# Patient Record
Sex: Male | Born: 1955 | Race: White | Hispanic: No | Marital: Single | Smoking: Current every day smoker
Health system: Southern US, Community
[De-identification: ages and names within clinical notes are randomized; demographics above are authoritative.]

## PROBLEM LIST (undated history)

## (undated) DIAGNOSIS — Z9889 Other specified postprocedural states: Secondary | ICD-10-CM

## (undated) DIAGNOSIS — E039 Hypothyroidism, unspecified: Secondary | ICD-10-CM

## (undated) DIAGNOSIS — I10 Essential (primary) hypertension: Secondary | ICD-10-CM

## (undated) DIAGNOSIS — E785 Hyperlipidemia, unspecified: Secondary | ICD-10-CM

## (undated) DIAGNOSIS — M1990 Unspecified osteoarthritis, unspecified site: Secondary | ICD-10-CM

## (undated) DIAGNOSIS — Z720 Tobacco use: Secondary | ICD-10-CM

## (undated) HISTORY — DX: Unspecified osteoarthritis, unspecified site: M19.90

## (undated) HISTORY — PX: BACK SURGERY: SHX140

## (undated) HISTORY — DX: Hyperlipidemia, unspecified: E78.5

## (undated) HISTORY — DX: Hypothyroidism, unspecified: E03.9

---

## 2013-11-27 ENCOUNTER — Emergency Department: Payer: Self-pay | Admitting: Emergency Medicine

## 2014-05-19 DIAGNOSIS — Z8673 Personal history of transient ischemic attack (TIA), and cerebral infarction without residual deficits: Secondary | ICD-10-CM

## 2014-05-19 HISTORY — DX: Personal history of transient ischemic attack (TIA), and cerebral infarction without residual deficits: Z86.73

## 2014-12-26 ENCOUNTER — Inpatient Hospital Stay (HOSPITAL_COMMUNITY)
Admit: 2014-12-26 | Discharge: 2014-12-26 | Disposition: A | Discharge status: Home / Self Care | Payer: Self-pay | Attending: Internal Medicine | Admitting: Internal Medicine

## 2014-12-26 ENCOUNTER — Emergency Department: Payer: Self-pay

## 2014-12-26 ENCOUNTER — Inpatient Hospital Stay: Payer: Self-pay

## 2014-12-26 ENCOUNTER — Inpatient Hospital Stay
Admission: EM | Admit: 2014-12-26 | Discharge: 2014-12-28 | DRG: 066 | Disposition: A | Discharge status: Home / Self Care | Payer: Self-pay | Attending: Internal Medicine | Admitting: Internal Medicine

## 2014-12-26 DIAGNOSIS — I10 Essential (primary) hypertension: Secondary | ICD-10-CM | POA: Diagnosis present

## 2014-12-26 DIAGNOSIS — I639 Cerebral infarction, unspecified: Principal | ICD-10-CM | POA: Diagnosis present

## 2014-12-26 DIAGNOSIS — G459 Transient cerebral ischemic attack, unspecified: Secondary | ICD-10-CM

## 2014-12-26 DIAGNOSIS — Z9889 Other specified postprocedural states: Secondary | ICD-10-CM

## 2014-12-26 DIAGNOSIS — Z716 Tobacco abuse counseling: Secondary | ICD-10-CM | POA: Diagnosis present

## 2014-12-26 DIAGNOSIS — D72829 Elevated white blood cell count, unspecified: Secondary | ICD-10-CM | POA: Diagnosis present

## 2014-12-26 DIAGNOSIS — F17210 Nicotine dependence, cigarettes, uncomplicated: Secondary | ICD-10-CM | POA: Diagnosis present

## 2014-12-26 DIAGNOSIS — I161 Hypertensive emergency: Secondary | ICD-10-CM | POA: Diagnosis present

## 2014-12-26 DIAGNOSIS — Z833 Family history of diabetes mellitus: Secondary | ICD-10-CM

## 2014-12-26 HISTORY — DX: Tobacco use: Z72.0

## 2014-12-26 HISTORY — DX: Other specified postprocedural states: Z98.890

## 2014-12-26 HISTORY — DX: Essential (primary) hypertension: I10

## 2014-12-26 LAB — COMPREHENSIVE METABOLIC PANEL
ALT: 28 U/L (ref 17–63)
ANION GAP: 11 (ref 5–15)
AST: 34 U/L (ref 15–41)
Albumin: 4.8 g/dL (ref 3.5–5.0)
Alkaline Phosphatase: 81 U/L (ref 38–126)
BUN: 16 mg/dL (ref 6–20)
CHLORIDE: 103 mmol/L (ref 101–111)
CO2: 25 mmol/L (ref 22–32)
CREATININE: 1.22 mg/dL (ref 0.61–1.24)
Calcium: 9.9 mg/dL (ref 8.9–10.3)
GFR calc Af Amer: >60 mL/min (ref >60)
GFR calc non Af Amer: >60 mL/min (ref >60)
Glucose, Bld: 127 mg/dL — ABNORMAL HIGH (ref 65–99)
Potassium: 3.5 mmol/L (ref 3.5–5.1)
Sodium: 139 mmol/L (ref 135–145)
Total Bilirubin: 0.4 mg/dL (ref 0.3–1.2)
Total Protein: 8.2 g/dL — ABNORMAL HIGH (ref 6.5–8.1)

## 2014-12-26 LAB — DIFFERENTIAL
Basophils Absolute: 0.1 10*3/uL (ref 0–0.1)
Basophils Relative: 1 %
Eosinophils Absolute: 0.2 10*3/uL (ref 0–0.7)
Eosinophils Relative: 1 %
LYMPHS PCT: 24 %
Lymphs Abs: 3.3 10*3/uL (ref 1.0–3.6)
MONO ABS: 1.3 10*3/uL — AB (ref 0.2–1.0)
Monocytes Relative: 10 %
Neutro Abs: 9 10*3/uL — ABNORMAL HIGH (ref 1.4–6.5)
Neutrophils Relative %: 64 %

## 2014-12-26 LAB — CBC
HCT: 46 % (ref 40.0–52.0)
Hemoglobin: 15.6 g/dL (ref 13.0–18.0)
MCH: 29.4 pg (ref 26.0–34.0)
MCHC: 33.8 g/dL (ref 32.0–36.0)
MCV: 86.9 fL (ref 80.0–100.0)
PLATELETS: 360 10*3/uL (ref 150–440)
RBC: 5.3 MIL/uL (ref 4.40–5.90)
RDW: 12.7 % (ref 11.5–14.5)
WBC: 13.9 10*3/uL — ABNORMAL HIGH (ref 3.8–10.6)

## 2014-12-26 LAB — MRSA PCR SCREENING: MRSA BY PCR: NEGATIVE

## 2014-12-26 LAB — TROPONIN I: Troponin I: <0.03 ng/mL (ref <0.031)

## 2014-12-26 LAB — APTT: APTT: 38 s — AB (ref 24–36)

## 2014-12-26 LAB — PROTIME-INR
INR: 0.83
Prothrombin Time: 11.6 seconds (ref 11.4–15.0)

## 2014-12-26 MED ORDER — LABETALOL HCL 5 MG/ML IV SOLN
10.0000 mg | Freq: Once | INTRAVENOUS | Status: AC
Start: 2014-12-26 — End: 2014-12-26
  Administered 2014-12-26: 20 mg via INTRAVENOUS

## 2014-12-26 MED ORDER — LABETALOL HCL 5 MG/ML IV SOLN
20.0000 mg | Freq: Once | INTRAVENOUS | Status: AC
Start: 2014-12-26 — End: 2014-12-26
  Administered 2014-12-26: 20 mg via INTRAVENOUS
  Filled 2014-12-26: qty 4

## 2014-12-26 MED ORDER — ENOXAPARIN SODIUM 40 MG/0.4ML ~~LOC~~ SOLN
40.0000 mg | SUBCUTANEOUS | Status: DC
  Administered 2014-12-26 – 2014-12-27 (×2): 40 mg via SUBCUTANEOUS
  Filled 2014-12-26 (×2): qty 0.4

## 2014-12-26 MED ORDER — LABETALOL HCL 5 MG/ML IV SOLN
0.5000 mg/min | INTRAVENOUS | Status: DC
  Administered 2014-12-26: 0.5 mg/min via INTRAVENOUS
  Filled 2014-12-26: qty 100

## 2014-12-26 MED ORDER — TRIAMTERENE-HCTZ 37.5-25 MG PO TABS
1.0000 | ORAL_TABLET | Freq: Every day | ORAL | Status: DC
  Administered 2014-12-26 – 2014-12-28 (×3): 1 via ORAL
  Filled 2014-12-26 (×3): qty 1

## 2014-12-26 MED ORDER — ACETAMINOPHEN 650 MG RE SUPP: 650.0000 mg | Freq: Four times a day (QID) | RECTAL | Status: DC | PRN

## 2014-12-26 MED ORDER — ATORVASTATIN CALCIUM 20 MG PO TABS
40.0000 mg | ORAL_TABLET | Freq: Every day | ORAL | Status: DC
  Administered 2014-12-26 – 2014-12-27 (×2): 40 mg via ORAL
  Filled 2014-12-26 (×2): qty 2

## 2014-12-26 MED ORDER — ONDANSETRON HCL 4 MG/2ML IJ SOLN: 4.0000 mg | Freq: Four times a day (QID) | INTRAMUSCULAR | Status: DC | PRN

## 2014-12-26 MED ORDER — ASPIRIN EC 81 MG PO TBEC
81.0000 mg | DELAYED_RELEASE_TABLET | Freq: Every day | ORAL | Status: DC
  Administered 2014-12-27 – 2014-12-28 (×2): 81 mg via ORAL
  Filled 2014-12-26 (×2): qty 1

## 2014-12-26 MED ORDER — LABETALOL HCL 5 MG/ML IV SOLN
INTRAVENOUS | Status: AC
  Administered 2014-12-26: 20 mg via INTRAVENOUS
  Filled 2014-12-26: qty 4

## 2014-12-26 MED ORDER — SENNOSIDES-DOCUSATE SODIUM 8.6-50 MG PO TABS: 1.0000 | ORAL_TABLET | Freq: Every evening | ORAL | Status: DC | PRN

## 2014-12-26 MED ORDER — ALBUTEROL SULFATE (2.5 MG/3ML) 0.083% IN NEBU
2.5000 mg | INHALATION_SOLUTION | RESPIRATORY_TRACT | Status: DC | PRN
Start: 2014-12-26 — End: 2014-12-28

## 2014-12-26 MED ORDER — ACETAMINOPHEN 325 MG PO TABS: 650.0000 mg | ORAL_TABLET | Freq: Four times a day (QID) | ORAL | Status: DC | PRN

## 2014-12-26 MED ORDER — SODIUM CHLORIDE 0.9 % IJ SOLN
3.0000 mL | Freq: Two times a day (BID) | INTRAMUSCULAR | Status: DC
  Administered 2014-12-26 – 2014-12-28 (×4): 3 mL via INTRAVENOUS

## 2014-12-26 MED ORDER — ASPIRIN 81 MG PO CHEW
324.0000 mg | CHEWABLE_TABLET | Freq: Once | ORAL | Status: AC
  Administered 2014-12-26: 324 mg via ORAL
  Filled 2014-12-26: qty 4

## 2014-12-26 MED ORDER — ONDANSETRON HCL 4 MG PO TABS: 4.0000 mg | ORAL_TABLET | Freq: Four times a day (QID) | ORAL | Status: DC | PRN

## 2014-12-26 MED ORDER — IBUPROFEN 400 MG PO TABS
200.0000 mg | ORAL_TABLET | Freq: Four times a day (QID) | ORAL | Status: DC | PRN
Start: 2014-12-26 — End: 2014-12-28

## 2014-12-26 NOTE — H&P (Signed)
[facility] [place] at [facility]   PATIENT NAME: Alejandro Rush    MR#:  [id number]  DATE OF BIRTH:  23-Apr-1956  DATE OF ADMISSION:  12/26/2014  PRIMARY CARE PHYSICIAN: No primary care provider on file.   REQUESTING/REFERRING PHYSICIAN: Dr. Lenard Lance  CHIEF COMPLAINT:   Chief Complaint  Patient presents with  . Code Stroke    HISTORY OF PRESENT ILLNESS:  Alejandro Rush  is a 59 y.o. male with a known history of hypertension and tobacco abuse presents to the emergency room with acute onset of right-sided numbness at 1:30 PM. A code stroke was alerted. Neurology has seen the patient. With only numbness and low NIH score patient was not thought to be a TPA candidate by neurology. Patient's blood pressure has been severely elevated at 240 /140. He has received IV labetalol pushes with no significant improvement. Symptoms are the same. CT scan of the head showed no acute stroke. No dysphagia or change in vision. No motor weakness. No history of stroke.  PAST MEDICAL HISTORY:   Past Medical History  Diagnosis Date  . Hypertension   . Previous back surgery   . Tobacco abuse     PAST SURGICAL HISTORY:   Past Surgical History  Procedure Laterality Date  . Back surgery      SOCIAL HISTORY:   History  Substance Use Topics  . Smoking status: Current Every Day Smoker -- 1.00 packs/day    Types: Cigarettes  . Smokeless tobacco: Not on file  . Alcohol Use: Yes    FAMILY HISTORY:   Family History  Problem Relation Age of Onset  . Diabetes Mother     DRUG ALLERGIES:  No Known Allergies  REVIEW OF SYSTEMS:   Review of Systems  Constitutional: Positive for malaise/fatigue. Negative for fever, chills and weight loss.  HENT: Negative for hearing loss and nosebleeds.   Eyes: Negative for blurred vision, double vision and pain.  Respiratory: Negative for cough, hemoptysis, sputum production, shortness of breath and wheezing.   Cardiovascular:  Negative for chest pain, palpitations, orthopnea and leg swelling.  Gastrointestinal: Negative for nausea, vomiting, abdominal pain, diarrhea and constipation.  Genitourinary: Negative for dysuria and hematuria.  Musculoskeletal: Positive for back pain. Negative for myalgias and falls.  Skin: Negative for rash.  Neurological: Positive for sensory change. Negative for dizziness, tremors, speech change, focal weakness, seizures and headaches.  Endo/Heme/Allergies: Does not bruise/bleed easily.  Psychiatric/Behavioral: Negative for depression and memory loss. The patient is not nervous/anxious.     MEDICATIONS AT HOME:   Prior to Admission medications   Not on File      VITAL SIGNS:  Blood pressure 191/128, pulse 81, temperature 97.8 F (36.6 C), temperature source Oral, resp. rate 14, height 6' (1.829 m), weight 104.327 kg (230 lb), SpO2 99 %.  PHYSICAL EXAMINATION:  Physical Exam  GENERAL:  59 y.o.-year-old patient lying in the bed with no acute distress.  EYES: Pupils equal, round, reactive to light and accommodation. No scleral icterus. Extraocular muscles intact.  HEENT: Head atraumatic, normocephalic. Oropharynx and nasopharynx clear. No oropharyngeal erythema, moist oral mucosa  NECK:  Supple, no jugular venous distention. No thyroid enlargement, no tenderness.  LUNGS: Normal breath sounds bilaterally, no wheezing, rales, rhonchi. No use of accessory muscles of respiration.  CARDIOVASCULAR: S1, S2 normal. No murmurs, rubs, or gallops.  ABDOMEN: Soft, nontender, nondistended. Bowel sounds present. No organomegaly or mass.  EXTREMITIES: No pedal edema, cyanosis, or clubbing. + 2 pedal & radial  pulses b/l.   NEUROLOGIC: Cranial nerves II through XII are intact. No focal Motor deficits appreciated b/l. Right numbness. PSYCHIATRIC: The patient is alert and oriented x 3. Good affect.  SKIN: No obvious rash, lesion, or ulcer.   LABORATORY PANEL:   CBC  Recent Labs Lab  12/26/14 1521  WBC 13.9*  HGB 15.6  HCT 46.0  PLT 360   ------------------------------------------------------------------------------------------------------------------  Chemistries   Recent Labs Lab 12/26/14 1521  NA 139  K 3.5  CL 103  CO2 25  GLUCOSE 127*  BUN 16  CREATININE 1.22  CALCIUM 9.9  AST 34  ALT 28  ALKPHOS 81  BILITOT 0.4   ------------------------------------------------------------------------------------------------------------------  Cardiac Enzymes  Recent Labs Lab 12/26/14 1521  TROPONINI <0.03   ------------------------------------------------------------------------------------------------------------------  RADIOLOGY:  Ct Head Wo Contrast  12/26/2014   CLINICAL DATA:  Right facial pain and right arm numbness starting 1 p.m. today  EXAM: CT HEAD WITHOUT CONTRAST  TECHNIQUE: Contiguous axial images were obtained from the base of the skull through the vertex without intravenous contrast.  COMPARISON:  None.  FINDINGS: No skull fracture is noted. Paranasal sinuses and mastoid air cells are unremarkable. No intracranial hemorrhage, mass effect or midline shift.  No acute cortical infarction. No mass lesion is noted on this unenhanced scan. The gray and white-matter differentiation is preserved. 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  IMPRESSION: No acute intracranial abnormality. No definite acute cortical infarction. No hemorrhage or mass effect.  These results were called by telephone at the time of interpretation on 12/26/2014 at 3:18 pm to Dr. Sharman Cheek , who verbally acknowledged these results.   Electronically Signed   By: Natasha Mead M.D.   On: 12/26/2014 15:18     IMPRESSION AND PLAN:   * Acute CVA with right-sided numbness. Not a candidate for TPA. Seen by tele neurology. Start aspirin and statin. Order MRI of the brain along with echocardiogram and carotid Doppler studies. Telemetry monitoring. Neuro  checks every 4 hours. Consult neurology.  * Hypertensive emergency. Blood pressure severely elevated at systolic of 240. He has received labetalol pushes with no significant response. Start labetalol drip and admitted to ICU.  * Tobacco abuse - counseled to quit. > 3 minutes spent.  All the records are reviewed and case discussed with ED provider. Management plans discussed with the patient, family and they are in agreement.  CODE STATUS: FULL  TOTAL CRITICAL CARE TIME TAKING CARE OF THIS PATIENT: 35 minutes.    Milagros Loll R M.D on 12/26/2014 at 4:45 PM  Between 7am to 6pm - Pager - [phone]  After 6pm go to [email or web address] - password [email or web address] [facility]  [facility]  Office  [phone]  CC: Primary care physician; No primary care provider on file.

## 2014-12-26 NOTE — Progress Notes (Signed)
*  PRELIMINARY RESULTS* Echocardiogram 2D Echocardiogram has been performed.  Alejandro Rush 12/26/2014, 10:11 PM

## 2014-12-26 NOTE — ED Notes (Signed)
MD at bedside., Dr.Paduchowski  

## 2014-12-26 NOTE — ED Notes (Signed)
Family at bedside. 

## 2014-12-26 NOTE — ED Notes (Signed)
teleneuro , speaking to pt

## 2014-12-26 NOTE — ED Notes (Signed)
Pt sent from PCP with c/o sudden onset right facial, arm, leg numbness that started at 1pm today.  No noted facial drop at present.   No slurred speech

## 2014-12-26 NOTE — ED Provider Notes (Signed)
[facility] Emergency Department Provider Note  Time seen: 3:36 PM  I have reviewed the triage vital signs and the nursing notes.   HISTORY  Chief Complaint Code Stroke    HPI Alejandro Rush is a 59 y.o. male with a past medical history of hypertension presents the emergency department with right-sided numbness.Patient states that 1 PM today he developed right-sided numbness. Denies any weakness, headache, slurred speech or confusion. He states the right-sided numbness is mostly in the right side of his face and right arm. Denies any history of CVA in the past. Patient went to [place] clinic, and was quickly referred to the emergency department. Denies any headache at this time. Patient does have significant hypertension 250/140 currently. Denies nausea, vomiting, chest pain.     Past Medical History  Diagnosis Date  . Hypertension     There are no active problems to display for this patient.   No past surgical history on file.  No current outpatient prescriptions on file.  Allergies Review of patient's allergies indicates no known allergies.  No family history on file.  Social History History  Substance Use Topics  . Smoking status: Not on file  . Smokeless tobacco: Not on file  . Alcohol Use: Not on file    Review of Systems Constitutional: Negative for fever Cardiovascular: Negative for chest pain. Respiratory: Negative for shortness of breath. Gastrointestinal: Negative for abdominal pain Neurological: Negative for headaches, focal weakness. Positive for right-sided numbness.  10-point ROS otherwise negative.  ____________________________________________   PHYSICAL EXAM:  VITAL SIGNS: ED Triage Vitals  Enc Vitals Group     BP 12/26/14 1508 251/142 mmHg     Pulse Rate 12/26/14 1508 101     Resp 12/26/14 1508 20     Temp 12/26/14 1508 97.8 F (36.6 C)     Temp Source 12/26/14 1508 Oral     SpO2 12/26/14 1508 98 %     Weight  12/26/14 1508 230 lb (104.327 kg)     Height 12/26/14 1508 6' (1.829 m)     Head Cir --      Peak Flow --      Pain Score --      Pain Loc --      Pain Edu? --      Excl. in GC? --     Constitutional: Alert and oriented. Well appearing and in no distress. Eyes: Normal exam ENT   Head: Normocephalic and atraumatic Cardiovascular: Normal rate, regular rhythm. No murmur Respiratory: Normal respiratory effort without tachypnea nor retractions. Breath sounds are clear and equal bilaterally. No wheezes/rales/rhonchi. Gastrointestinal: Soft and nontender. No distention.   Musculoskeletal: Nontender with normal range of motion in all extremities.  Neurologic:  Normal speech and language. 5/5 motor in all extremities. Motor exam somewhat limited in left lower extremity due to prior knee injury, the motor appears 5/5. No pronator drift in upper extremities. No drift in lower extremities. Cranial nerves intact, no facial droop. Speech is normal. Patient does have an irregularity of the pupils, right pupils proximally 3 mm, left pupil 2 mm. Both pupils are reactive. Patient states sensation is equal in bilateral lower extremities. He states sensation is significantly decreased in right upper extremity and right face. He is able to feel me touching him, but states it feels very numb Skin:  Skin is warm, dry and intact.  Psychiatric: Mood and affect are normal. Speech and behavior are normal.   ____________________________________________    EKG  EKG reviewed and interpreted by myself shows sinus rhythm at 97 bpm with frequent PVCs. Narrow QRS, normal axis, nonspecific ST changes are present. No ST elevations are noted.  ____________________________________________    RADIOLOGY  CT head within normal limits  ____________________________________________  INITIAL IMPRESSION / ASSESSMENT AND PLAN / ED COURSE  Pertinent labs & imaging results that were available during my care of the patient  were reviewed by me and considered in my medical decision making (see chart for details).  Patient with symptoms suggestive of CVA. Onset of symptoms at 1 PM, currently within the TPA window. We have consultative telemetry neurology stat for evaluation. Patient's only findings on exam or a subjective sensory deficit to the right arm and right face. Patient is quite hypertensive 250/140, I am currently treating with 20 mg IV labetalol. Question pres syndrome versus CVA. Current NIH stroke scale of 1.   NIH Stroke Scale    Time: 3:42 PM Person Administering Scale: Jaretzi Droz  Administer stroke scale items in the order listed. Record performance in each category after each subscale exam. Do not go back and change scores. Follow directions provided for each exam technique. Scores should reflect what the patient does, not what the clinician thinks the patient can do. The clinician should record answers while administering the exam and work quickly. Except where indicated, the patient should not be coached (i.e., repeated requests to patient to make a special effort).   1a  Level of consciousness: 0=alert; keenly responsive  1b. LOC questions:  0=Performs both tasks correctly  1c. LOC commands: 0=Performs both tasks correctly  2.  Best Gaze: 0=normal  3.  Visual: 0=No visual loss  4. Facial Palsy: 0=Normal symmetric movement  5a.  Motor left arm: 0=No drift, limb holds 90 (or 45) degrees for full 10 seconds  5b.  Motor right arm: 0=No drift, limb holds 90 (or 45) degrees for full 10 seconds  6a. motor left leg: 0=No drift, limb holds 90 (or 45) degrees for full 10 seconds  6b  Motor right leg:  0=No drift, limb holds 90 (or 45) degrees for full 10 seconds  7. Limb Ataxia: 0=Absent  8.  Sensory: 1=Mild to moderate sensory loss; patient feels pinprick is less sharp or is dull on the affected side; there is a loss of superficial pain with pinprick but patient is aware He is being touched   9. Best Language:  0=No aphasia, normal  10. Dysarthria: 0=Normal  11. Extinction and Inattention: 0=No abnormality  12. Distal motor function: 0=Normal   Total:   1     ----------------------------------------- 4:22 PM on 12/26/2014 -----------------------------------------  Specialist on call has seen the patient, and does not recommend TPA at this time. We will continue with blood pressure control. Patient currently on a labetalol drip.  Patient to be admitted to the hospital for CVA workup, and hypertensive emergency treatment. Labs largely within normal limits besides mild leukocytosis of 13.9   CRITICAL CARE Performed by: Minna Antis   Total critical care time: 45 minutes  Critical care time was exclusive of separately billable procedures and treating other patients.  Critical care was necessary to treat or prevent imminent or life-threatening deterioration.  Critical care was time spent personally by me on the following activities: development of treatment plan with patient and/or surrogate as well as nursing, discussions with consultants, evaluation of patient's response to treatment, examination of patient, obtaining history from patient or surrogate, ordering and performing treatments and interventions, ordering and  review of laboratory studies, ordering and review of radiographic studies, pulse oximetry and re-evaluation of patient's condition.    ____________________________________________   FINAL CLINICAL IMPRESSION(S) / ED DIAGNOSES  Right-sided numbness Cerebrovascular accident Hypertensive emergency   Minna Antis, MD 12/26/14 1623

## 2014-12-26 NOTE — ED Notes (Signed)
Patient transported to Ultrasound 

## 2014-12-26 NOTE — ED Notes (Signed)
MD at bedside.admitting MD at bedside

## 2014-12-27 ENCOUNTER — Inpatient Hospital Stay: Payer: Self-pay

## 2014-12-27 DIAGNOSIS — I63412 Cerebral infarction due to embolism of left middle cerebral artery: Secondary | ICD-10-CM

## 2014-12-27 LAB — LIPID PANEL
CHOLESTEROL: 221 mg/dL — AB (ref 0–200)
HDL: 42 mg/dL (ref >40)
LDL Cholesterol: 128 mg/dL — ABNORMAL HIGH (ref 0–99)
Total CHOL/HDL Ratio: 5.3 RATIO
Triglycerides: 255 mg/dL — ABNORMAL HIGH (ref <150)
VLDL: 51 mg/dL — AB (ref 0–40)

## 2014-12-27 MED ORDER — HYDRALAZINE HCL 20 MG/ML IJ SOLN: 10.0000 mg | Freq: Four times a day (QID) | INTRAMUSCULAR | Status: DC | PRN

## 2014-12-27 MED ORDER — HYDRALAZINE HCL 25 MG PO TABS
25.0000 mg | ORAL_TABLET | Freq: Three times a day (TID) | ORAL | Status: DC
  Administered 2014-12-27 – 2014-12-28 (×3): 25 mg via ORAL
  Filled 2014-12-27 (×3): qty 1

## 2014-12-27 NOTE — Evaluation (Signed)
Physical Therapy Evaluation Patient Details Name: Alejandro Rush MRN: [id number] DOB: 03/22/1956 Today's Date: 12/27/2014   History of Present Illness  Pt admitted to hospital for reported numbness/tingling on R side of body, diagnosed with acute CVA of L thalamus.  Pt was transferred to ICU for hypertensive emergency, at one point BP was measured at 240/140.     Clinical Impression  Pt reports having no trouble with moving prior to session, but still reports R sided sensation as "more faint than the L side." Pt bilat UE/LE strength was measured at 5/5 MMT and pt was able to complete all transfers independently (ambulated 148ft).  With neuro screening, pt described R side of face/UE/LE as feeling more faint than the L side, RAMPs and opposition appeared normal bilat. Pt's 5x sit to stand measured at 12 sec. No acute PT needs identified.    Follow Up Recommendations No PT follow up    Equipment Recommendations       Recommendations for Other Services       Precautions / Restrictions Precautions Precautions: None Restrictions Weight Bearing Restrictions: No      Mobility  Bed Mobility Overal bed mobility: Independent                Transfers Overall transfer level: Independent Equipment used: Rolling walker (2 wheeled);None (For safety puposes.  Pt didn't require this device.)                Ambulation/Gait Ambulation/Gait assistance: Independent Ambulation Distance (Feet): 150 Feet Assistive device: None Gait Pattern/deviations: WFL(Within Functional Limits)        Stairs            Wheelchair Mobility    Modified Rankin (Stroke Patients Only)       Balance Overall balance assessment: Independent                                           Pertinent Vitals/Pain Pain Assessment: No/denies pain    Home Living Family/patient expects to be discharged to:: Private residence Living Arrangements: Spouse/significant  other Available Help at Discharge: Family (Son) Type of Home: House Home Access: Stairs to enter Entrance Stairs-Rails: Can reach both Entrance Stairs-Number of Steps: 3 Home Layout: One level Home Equipment: None      Prior Function Level of Independence: Independent               Hand Dominance        Extremity/Trunk Assessment   Upper Extremity Assessment: Overall WFL for tasks assessed;RUE deficits/detail RUE Deficits / Details: RAMPS and opposition are normal.   RUE Sensation: decreased light touch     Lower Extremity Assessment: Overall WFL for tasks assessed;RLE deficits/detail         Communication   Communication: No difficulties  Cognition Arousal/Alertness: Awake/alert Behavior During Therapy: WFL for tasks assessed/performed Overall Cognitive Status: Within Functional Limits for tasks assessed                      General Comments General comments (skin integrity, edema, etc.): Pt was able to complete transfers and ambulation with no difficulty or assist/DME equipment required.  Pt was able to complete 5x sit to stand in 12 sec.        Exercises        Assessment/Plan    PT Assessment Patent does not need any further  PT services  PT Diagnosis     PT Problem List    PT Treatment Interventions     PT Goals (Current goals can be found in the Care Plan section) Acute Rehab PT Goals Patient Stated Goal: to get back to work and fishing PT Goal Formulation: With patient/family Time For Goal Achievement: 01/10/15 Potential to Achieve Goals: Good    Frequency     Barriers to discharge        Co-evaluation               End of Session Equipment Utilized During Treatment: Gait belt Activity Tolerance: Patient tolerated treatment well Patient left: in chair;with call bell/phone within reach;with family/visitor present Nurse Communication: Mobility status         Time: 1101-1119 PT Time Calculation (min) (ACUTE ONLY): 18  min   Charges:         PT G CodesAngie Fava, SPT 12/27/2014 1:07 PM

## 2014-12-27 NOTE — Consult Note (Signed)
CC: R sided numbness   HPI: Alejandro Rush is an 59 y.o. male with a known history of hypertension and tobacco abuse presents to the emergency room with acute onset of right-sided numbness at 1:30 PM. Still states numb on R side. CTH no acute abnormality  MRI brain small L thalamic infarct.   Past Medical History  Diagnosis Date  . Hypertension   . Previous back surgery   . Tobacco abuse     Past Surgical History  Procedure Laterality Date  . Back surgery      Family History  Problem Relation Age of Onset  . Diabetes Mother     Social History:  reports that he has been smoking Cigarettes.  He has been smoking about 1.00 pack per day. He does not have any smokeless tobacco history on file. He reports that he drinks alcohol. His drug history is not on file.  No Known Allergies  Medications: I have reviewed the patient's current medications.  ROS: History obtained from the patient  General ROS: negative for - chills, fatigue, fever, night sweats, weight gain or weight loss Psychological ROS: negative for - behavioral disorder, hallucinations, memory difficulties, mood swings or suicidal ideation Ophthalmic ROS: negative for - blurry vision, double vision, eye pain or loss of vision ENT ROS: negative for - epistaxis, nasal discharge, oral lesions, sore throat, tinnitus or vertigo Allergy and Immunology ROS: negative for - hives or itchy/watery eyes Hematological and Lymphatic ROS: negative for - bleeding problems, bruising or swollen lymph nodes Endocrine ROS: negative for - galactorrhea, hair pattern changes, polydipsia/polyuria or temperature intolerance Respiratory ROS: negative for - cough, hemoptysis, shortness of breath or wheezing Cardiovascular ROS: negative for - chest pain, dyspnea on exertion, edema or irregular heartbeat Gastrointestinal ROS: negative for - abdominal pain, diarrhea, hematemesis, nausea/vomiting or stool incontinence Genito-Urinary ROS: negative for  - dysuria, hematuria, incontinence or urinary frequency/urgency Musculoskeletal ROS: negative for - joint swelling or muscular weakness Neurological ROS: as noted in HPI Dermatological ROS: negative for rash and skin lesion changes  Physical Examination: Blood pressure 142/86, pulse 74, temperature 97.5 F (36.4 C), temperature source Oral, resp. rate 15, height 6' (1.829 m), weight 100.6 kg (221 lb 12.5 oz), SpO2 98 %.    Neurological Examination Mental Status: Alert, oriented, thought content appropriate.  Speech fluent without evidence of aphasia.  Able to follow 3 step commands without difficulty. Cranial Nerves: II: Discs flat bilaterally; Visual fields grossly normal, pupils equal, round, reactive to light and accommodation III,IV, VI: ptosis not present, extra-ocular motions intact bilaterally V,VII: smile symmetric, facial light touch sensation normal bilaterally VIII: hearing normal bilaterally IX,X: gag reflex present XI: bilateral shoulder shrug XII: midline tongue extension Motor: Right : Upper extremity   5/5    Left:     Upper extremity   5/5  Lower extremity   5/5     Lower extremity   5/5 Tone and bulk:normal tone throughout; no atrophy noted Sensory: decreased R side to light touch and pressure Deep Tendon Reflexes: 2+ and symmetric throughout Plantars: Right: downgoing   Left: downgoing Cerebellar: normal finger-to-nose, normal rapid alternating movements and normal heel-to-shin test Gait: normal gait and station      Laboratory Studies:   Basic Metabolic Panel:  Recent Labs Lab 12/26/14 1521  NA 139  K 3.5  CL 103  CO2 25  GLUCOSE 127*  BUN 16  CREATININE 1.22  CALCIUM 9.9    Liver Function Tests:  Recent Labs Lab 12/26/14  1521  AST 34  ALT 28  ALKPHOS 81  BILITOT 0.4  PROT 8.2*  ALBUMIN 4.8   No results for input(s): LIPASE, AMYLASE in the last 168 hours. No results for input(s): AMMONIA in the last 168 hours.  CBC:  Recent  Labs Lab 12/26/14 1521  WBC 13.9*  NEUTROABS 9.0*  HGB 15.6  HCT 46.0  MCV 86.9  PLT 360    Cardiac Enzymes:  Recent Labs Lab 12/26/14 1521  TROPONINI <0.03    BNP: Invalid input(s): POCBNP  CBG: No results for input(s): GLUCAP in the last 168 hours.  Microbiology: Results for orders placed or performed during the hospital encounter of 12/26/14  MRSA PCR Screening     Status: None   Collection Time: 12/26/14  6:11 PM  Result Value Ref Range Status   MRSA by PCR NEGATIVE NEGATIVE Final    Comment:        The GeneXpert MRSA Assay (FDA approved for NASAL specimens only), is one component of a comprehensive MRSA colonization surveillance program. It is not intended to diagnose MRSA infection nor to guide or monitor treatment for MRSA infections.     Coagulation Studies:  Recent Labs  12/26/14 1521  LABPROT 11.6  INR 0.83    Urinalysis: No results for input(s): COLORURINE, LABSPEC, PHURINE, GLUCOSEU, HGBUR, BILIRUBINUR, KETONESUR, PROTEINUR, UROBILINOGEN, NITRITE, LEUKOCYTESUR in the last 168 hours.  Invalid input(s): APPERANCEUR  Lipid Panel:     Component Value Date/Time   CHOL 221* 12/27/2014 1027   TRIG 255* 12/27/2014 1027   HDL 42 12/27/2014 1027   CHOLHDL 5.3 12/27/2014 1027   VLDL 51* 12/27/2014 1027   LDLCALC 128* 12/27/2014 1027    HgbA1C: No results found for: HGBA1C  Urine Drug Screen:  No results found for: LABOPIA, COCAINSCRNUR, LABBENZ, AMPHETMU, THCU, LABBARB  Alcohol Level: No results for input(s): ETH in the last 168 hours.  Other results: EKG: normal EKG, normal sinus rhythm, unchanged from previous tracings.  Imaging: Ct Head Wo Contrast  12/26/2014   CLINICAL DATA:  Right facial pain and right arm numbness starting 1 p.m. today  EXAM: CT HEAD WITHOUT CONTRAST  TECHNIQUE: Contiguous axial images were obtained from the base of the skull through the vertex without intravenous contrast.  COMPARISON:  None.  FINDINGS: No skull  fracture is noted. Paranasal sinuses and mastoid air cells are unremarkable. No intracranial hemorrhage, mass effect or midline shift.  No acute cortical infarction. No mass lesion is noted on this unenhanced scan. The gray and white-matter differentiation is preserved. 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  IMPRESSION: No acute intracranial abnormality. No definite acute cortical infarction. No hemorrhage or mass effect.  These results were called by telephone at the time of interpretation on 12/26/2014 at 3:18 pm to Dr. Sharman Cheek , who verbally acknowledged these results.   Electronically Signed   By: Natasha Mead M.D.   On: 12/26/2014 15:18   Mr Brain Wo Contrast  12/27/2014   CLINICAL DATA:  60 year old male with new onset right face arm and lower extremity numbness and tingling since yesterday. Symptoms persist.  EXAM: MRI HEAD WITHOUT CONTRAST  TECHNIQUE: Multiplanar, multiecho pulse sequences of the brain and surrounding structures were obtained without intravenous contrast.  COMPARISON:  Head CT without contrast 12/26/2014. Carotid Doppler ultrasound 12/26/2014.  FINDINGS: Cerebral volume is normal. Round 7 mm focus of restricted diffusion within the left thalamus (series 9, image 19). Mild T2 and FLAIR hyperintensity. No  associated hemorrhage or mass effect.  No other restricted diffusion. Major intracranial vascular flow voids are within normal limits.  Chronic micro hemorrhage in the right corona radiata (series 13, image 17). Otherwise minimal to mild for age nonspecific cerebral white matter T2 and FLAIR hyperintensity, mostly in the periatrial regions. Outside of the left thalamic findings negative deep gray matter nuclei, brainstem, and cerebellum.  Visible internal auditory structures appear normal. Mastoids are clear. Mild paranasal sinus mucosal thickening and trace right sphenoid sinus fluid level. Negative orbit and scalp soft tissues. Negative  visualized cervical spine. Normal bone marrow signal.  IMPRESSION: 1. Small acute lacunar infarct in the left thalamus. No hemorrhage or mass effect. 2. Otherwise mild for age signal changes compatible with chronic small vessel disease. 3. Stable mild sphenoid sinus inflammation.   Electronically Signed   By: Odessa Fleming M.D.   On: 12/27/2014 09:35   US Carotid Bilateral  12/26/2014   CLINICAL DATA:  Right upper extremity numbness  EXAM: BILATERAL CAROTID DUPLEX ULTRASOUND  TECHNIQUE: Wallace Cullens scale imaging, color Doppler and duplex ultrasound were performed of bilateral carotid and vertebral arteries in the neck.  COMPARISON:  None.  FINDINGS: Criteria: Quantification of carotid stenosis is based on velocity parameters that correlate the residual internal carotid diameter with NASCET-based stenosis levels, using the diameter of the distal internal carotid lumen as the denominator for stenosis measurement.  The following peak systolic velocity measurements were obtained:  RIGHT  ICA:  64 cm/sec  CCA:  75 cm/sec  SYSTOLIC ICA/CCA RATIO:  0.8  DIASTOLIC ICA/CCA RATIO:  1.2  ECA:  61 cm/sec  LEFT  ICA:  68 cm/sec  CCA:  75 cm/sec  SYSTOLIC ICA/CCA RATIO:  0.9  DIASTOLIC ICA/CCA RATIO:  1.4  ECA:  87 cm/sec  RIGHT CAROTID ARTERY: There is mild generalized intimal thickening. There is a small focus of calcification proximal right internal carotid artery causing perhaps 10% diameter stenosis. No stenosis approaching 50% diameter is seen on the right.  RIGHT VERTEBRAL ARTERY:  Flow is in the anatomic direction.  LEFT CAROTID ARTERY: There is mild generalized intimal thickening without appreciable obstructive plaque.  LEFT VERTEBRAL ARTERY:  Flow is in the anatomic direction.  IMPRESSION: No evidence of hemodynamically significant obstruction. Only slight intimal thickening and minimal plaque on the right seen. Flow in both vertebral arteries is antegrade.   Electronically Signed   By: Bretta Bang III M.D.   On: 12/26/2014  18:11     Assessment/Plan:  59 y.o. male with a known history of hypertension and tobacco abuse presents to the emergency room with acute onset of right-sided numbness at 1:30 PM. Still states numb on R side. CTH no acute abnormality  MRI brain small L thalamic infarct.   - was not on anti platelet therapy at home - ASA/Statin - 2d echo  - carotid doppler -HbA1c -d/c planning likely tomorrow 12/27/2014, 11:10 AM

## 2014-12-27 NOTE — Progress Notes (Signed)
Spoke with Dr. Sherryll Burger to d/c continuous cardiac monitoring to be able to send pt down to MRI. And to request that pt may be able to be sent with out RN. Orders received. Lovelace Cerveny E 7:43 AM 12/27/2014.

## 2014-12-27 NOTE — Progress Notes (Signed)
[facility] [place] [facility]   PATIENT NAME: Alejandro Rush    MR#:  [id number]  DATE OF BIRTH:  05/19/1956  SUBJECTIVE:  CHIEF COMPLAINT:   Chief Complaint  Patient presents with  . Code Stroke  improving rt numbness, all family at bedside. REVIEW OF SYSTEMS:  Review of Systems  Constitutional: Negative for fever, weight loss, malaise/fatigue and diaphoresis.  HENT: Negative for ear discharge, ear pain, hearing loss, nosebleeds, sore throat and tinnitus.   Eyes: Negative for blurred vision and pain.  Respiratory: Negative for cough, hemoptysis, shortness of breath and wheezing.   Cardiovascular: Negative for chest pain, palpitations, orthopnea and leg swelling.  Gastrointestinal: Negative for heartburn, nausea, vomiting, abdominal pain, diarrhea, constipation and blood in stool.  Genitourinary: Negative for dysuria, urgency and frequency.  Musculoskeletal: Negative for myalgias and back pain.  Skin: Negative for itching and rash.  Neurological: Positive for sensory change. Negative for dizziness, tingling, tremors, focal weakness, seizures, weakness and headaches.  Psychiatric/Behavioral: Negative for depression. The patient is not nervous/anxious.    DRUG ALLERGIES:  No Known Allergies VITALS:  Blood pressure 142/86, pulse 74, temperature 97.5 F (36.4 C), temperature source Oral, resp. rate 15, height 6' (1.829 m), weight 100.6 kg (221 lb 12.5 oz), SpO2 98 %. PHYSICAL EXAMINATION:  Physical Exam  Constitutional: He is oriented to person, place, and time and well-developed, well-nourished, and in no distress.  HENT:  Head: Normocephalic and atraumatic.  Eyes: Conjunctivae and EOM are normal. Pupils are equal, round, and reactive to light.  Neck: Normal range of motion. Neck supple. No tracheal deviation present. No thyromegaly present.  Cardiovascular: Normal rate, regular rhythm and normal heart sounds.   Pulmonary/Chest: Effort normal and  breath sounds normal. No respiratory distress. He has no wheezes. He exhibits no tenderness.  Abdominal: Soft. Bowel sounds are normal. He exhibits no distension. There is no tenderness.  Musculoskeletal: Normal range of motion.  Neurological: He is alert and oriented to person, place, and time. No cranial nerve deficit.  Skin: Skin is warm and dry. No rash noted.  Psychiatric: Mood and affect normal.   LABORATORY PANEL:   CBC  Recent Labs Lab 12/26/14 1521  WBC 13.9*  HGB 15.6  HCT 46.0  PLT 360   ------------------------------------------------------------------------------------------------------------------ Chemistries   Recent Labs Lab 12/26/14 1521  NA 139  K 3.5  CL 103  CO2 25  GLUCOSE 127*  BUN 16  CREATININE 1.22  CALCIUM 9.9  AST 34  ALT 28  ALKPHOS 81  BILITOT 0.4   RADIOLOGY:  Ct Head Wo Contrast  12/26/2014   CLINICAL DATA:  Right facial pain and right arm numbness starting 1 p.m. today  EXAM: CT HEAD WITHOUT CONTRAST  TECHNIQUE: Contiguous axial images were obtained from the base of the skull through the vertex without intravenous contrast.  COMPARISON:  None.  FINDINGS: No skull fracture is noted. Paranasal sinuses and mastoid air cells are unremarkable. No intracranial hemorrhage, mass effect or midline shift.  No acute cortical infarction. No mass lesion is noted on this unenhanced scan. The gray and white-matter differentiation is preserved. 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  IMPRESSION: No acute intracranial abnormality. No definite acute cortical infarction. No hemorrhage or mass effect.  These results were called by telephone at the time of interpretation on 12/26/2014 at 3:18 pm to Dr. Sharman Cheek , who verbally acknowledged these results.   Electronically Signed   By: Lang Snow  Pop M.D.   On: 12/26/2014 15:18   Mr Brain Wo Contrast  12/27/2014   CLINICAL DATA:  59 year old male with new onset right face arm  and lower extremity numbness and tingling since yesterday. Symptoms persist.  EXAM: MRI HEAD WITHOUT CONTRAST  TECHNIQUE: Multiplanar, multiecho pulse sequences of the brain and surrounding structures were obtained without intravenous contrast.  COMPARISON:  Head CT without contrast 12/26/2014. Carotid Doppler ultrasound 12/26/2014.  FINDINGS: Cerebral volume is normal. Round 7 mm focus of restricted diffusion within the left thalamus (series 9, image 19). Mild T2 and FLAIR hyperintensity. No associated hemorrhage or mass effect.  No other restricted diffusion. Major intracranial vascular flow voids are within normal limits.  Chronic micro hemorrhage in the right corona radiata (series 13, image 17). Otherwise minimal to mild for age nonspecific cerebral white matter T2 and FLAIR hyperintensity, mostly in the periatrial regions. Outside of the left thalamic findings negative deep gray matter nuclei, brainstem, and cerebellum.  Visible internal auditory structures appear normal. Mastoids are clear. Mild paranasal sinus mucosal thickening and trace right sphenoid sinus fluid level. Negative orbit and scalp soft tissues. Negative visualized cervical spine. Normal bone marrow signal.  IMPRESSION: 1. Small acute lacunar infarct in the left thalamus. No hemorrhage or mass effect. 2. Otherwise mild for age signal changes compatible with chronic small vessel disease. 3. Stable mild sphenoid sinus inflammation.   Electronically Signed   By: Odessa Fleming M.D.   On: 12/27/2014 09:35   US Carotid Bilateral  12/26/2014   CLINICAL DATA:  Right upper extremity numbness  EXAM: BILATERAL CAROTID DUPLEX ULTRASOUND  TECHNIQUE: Wallace Cullens scale imaging, color Doppler and duplex ultrasound were performed of bilateral carotid and vertebral arteries in the neck.  COMPARISON:  None.  FINDINGS: Criteria: Quantification of carotid stenosis is based on velocity parameters that correlate the residual internal carotid diameter with NASCET-based stenosis  levels, using the diameter of the distal internal carotid lumen as the denominator for stenosis measurement.  The following peak systolic velocity measurements were obtained:  RIGHT  ICA:  64 cm/sec  CCA:  75 cm/sec  SYSTOLIC ICA/CCA RATIO:  0.8  DIASTOLIC ICA/CCA RATIO:  1.2  ECA:  61 cm/sec  LEFT  ICA:  68 cm/sec  CCA:  75 cm/sec  SYSTOLIC ICA/CCA RATIO:  0.9  DIASTOLIC ICA/CCA RATIO:  1.4  ECA:  87 cm/sec  RIGHT CAROTID ARTERY: There is mild generalized intimal thickening. There is a small focus of calcification proximal right internal carotid artery causing perhaps 10% diameter stenosis. No stenosis approaching 50% diameter is seen on the right.  RIGHT VERTEBRAL ARTERY:  Flow is in the anatomic direction.  LEFT CAROTID ARTERY: There is mild generalized intimal thickening without appreciable obstructive plaque.  LEFT VERTEBRAL ARTERY:  Flow is in the anatomic direction.  IMPRESSION: No evidence of hemodynamically significant obstruction. Only slight intimal thickening and minimal plaque on the right seen. Flow in both vertebral arteries is antegrade.   Electronically Signed   By: Bretta Bang III M.D.   On: 12/26/2014 18:11   ASSESSMENT AND PLAN:   * Acute CVA with right-sided numbness: improving. continue aspirin and statin. MRI of the brain shows small acute lacunar infarct in the left thalamus. normal echocardiogram and neg carotid Doppler studies. Telemetry monitoring. Neuro checks every 4 hours. Consult neurology pending. PT/OT eval   * Hypertensive emergency. Blood pressure severely elevated at systolic of 240. He has received labetalol pushes with no significant response. off labetalol drip  Start hydralazine  for better BP control.  * Leukocytosis: likely stress response.  * Tobacco abuse - counseled to quit. > 3 minutes spent by Dr Elpidio Anis    All the records are reviewed and case discussed with Care Management/Social Worker. Management plans discussed with the patient, family and  they are in agreement.  CODE STATUS: Full Code  TOTAL TIME TAKING CARE OF THIS PATIENT: 35 minutes.   More than 50% of the time was spent in counseling/coordination of care: YES  POSSIBLE D/C IN 1 DAYS, DEPENDING ON CLINICAL CONDITION and Neuro eval    [facility], Deyra Perdomo M.D on 12/27/2014 at 10:14 AM  Between 7am to 6pm - Pager - [phone]  After 6pm go to [email or web address] - password [email or web address] [facility]  [place]  [facility]  Office  [phone]  CC:  Primary care physician; No primary care provider on file.

## 2014-12-27 NOTE — Progress Notes (Signed)
Pt taken to MRI by tech via bed. Alejandro Rush E 8:33 AM 12/27/2014.

## 2014-12-28 MED ORDER — ATORVASTATIN CALCIUM 40 MG PO TABS: 40.0000 mg | ORAL_TABLET | Freq: Every day | ORAL | Status: DC

## 2014-12-28 MED ORDER — HYDRALAZINE HCL 25 MG PO TABS: 25.0000 mg | ORAL_TABLET | Freq: Three times a day (TID) | ORAL | Status: DC

## 2014-12-28 MED ORDER — ASPIRIN 81 MG PO TBEC: 81.0000 mg | DELAYED_RELEASE_TABLET | Freq: Every day | ORAL | Status: DC

## 2014-12-28 NOTE — Discharge Instructions (Signed)
Ischemic Stroke Blood carries oxygen to all areas of your body. A stroke happens when your blood does not flow to your brain like normal. If this happens, your brain will not get the oxygen it needs and brain tissue will die. This is an emergency. Problems (symptoms) of a stroke usually happen suddenly. You may notice them when you wake up. They can include:  Loss of feeling or weakness on one side of the body (face, arm, leg).  Feeling confused.  Trouble talking or understanding.  Trouble seeing.  Trouble walking.  Feeling dizzy.  Loss of balance or coordination.  Severe headache without a cause.  Trouble reading or writing. Get help as soon as any of these problems first start. This is important.  RISK FACTORS  Risk factors are things that make it more likely for you to have a stroke. These things include:  High blood pressure (hypertension).  High cholesterol.  Diabetes.  Heart disease.  Having a buildup of fatty deposits in the blood vessels.  Having an abnormal heart rhythm (atrial fibrillation).  Being very overweight (obese).  Smoking.  Taking birth control pills, especially if you smoke.  **Not being active.  **Having a diet high in fats, salt, and calories.  Drinking too much alcohol.  Using illegal drugs.  Being African American.  Being over the age of 55.  Having a family history of stroke.  Having a history of blood clots, stroke, warning stroke (transient ischemic attack, TIA), or heart attack.  Sickle cell disease. HOME CARE  Take all medicines exactly as told by your doctor. Understand all your medicine instructions.  You may need to take a medicine to thin your blood, like aspirin or warfarin. Take warfarin exactly as told.  Taking too much or too little warfarin is dangerous. Get regular blood tests as told, including the PT and INR tests. The test results help your doctor adjust your dose of warfarin. Your PT and INR levels must be  done as often as told by your doctor.  Food can cause problems with warfarin and affect the results of your blood tests. This is true for foods high in vitamin K, such as spinach, kale, broccoli, cabbage, collard and turnip greens, Brussels sprouts, peas, cauliflower, seaweed, and parsley, as well as beef and pork liver, green tea, and soybean oil. Eat the same amount of food high in vitamin K. Avoid major changes in your diet. Tell your doctor before changing your diet. Talk to a food specialist (dietitian) if you have questions.  Many medicines can cause problems with warfarin and affect your PT and INR test results. Tell your doctor about all medicines you take. This includes vitamins and dietary pills (supplements). Be careful with aspirin and medicines that relieve redness, soreness, and puffiness (inflammation). Do not take or stop medicines unless your doctor tells you to.  Warfarin can cause a lot of bruising or bleeding. Hold pressure over cuts for longer than normal. Talk to your doctor about other side effects of warfarin.  Avoid sports or activities that may cause injury or bleeding.  Be careful when you shave, floss your teeth, or use sharp objects.  Avoid alcoholic drinks or drink very little alcohol while taking warfarin. Tell your doctor if you change how much alcohol you drink.  Tell your dentist and other doctors that you take warfarin before procedures.  If you are able to swallow, eat healthy foods. Eat 5 or more servings of fruits and vegetables a day. Eat soft   foods, pureed foods, or eat small bites of food so you do not choke.  Follow your diet program as told, if you are given one.  Keep a healthy weight.  Stay active. Try to get at least 30 minutes of activity on most or all days.  Do not smoke.  Limit how much alcohol you drink even if you are not taking warfarin. Moderate alcohol use is:  No more than 2 drinks each day for men.  No more than 1 drink each day  for women who are not pregnant.  Keep your home safe so you do not fall. Try:  Putting grab bars in the bedroom and bathroom.  Raising toilet seats.  Putting a seat in the shower.  Go to therapy sessions (physical, occupational, and speech) as told by your doctor.  Use a walker or cane at all times if told to do so.  Keep all doctor visits as told. GET HELP IF:  Your personality changes.  You have trouble swallowing.  You are seeing two of everything.  You are dizzy.  You have a fever.  Your skin starts to break down. GET HELP RIGHT AWAY IF:  The symptoms below may be a sign of an emergency. Do not wait to see if the symptoms go away. Call for help (911 in [place] Do not drive yourself to the hospital.  You have sudden weakness or numbness on the face, arm, or leg (especially on one side of the body).  You have sudden trouble walking or moving your arms or legs.  You have sudden confusion.  You have trouble talking or understanding.  You have sudden trouble seeing in one or both eyes.  You lose your balance or your movements are not smooth.  You have a sudden, severe headache with no known cause.  You have new chest pain or you feel your heart beating in an unsteady way.  You are partly or totally unaware of what is going on around you. Document Released: 04/24/2011 Document Revised: 09/19/2013 Document Reviewed: 12/14/2011 [employer] Patient Information 2015 [employer]. This information is not intended to replace advice given to you by your health care provider. Make sure you discuss any questions you have with your health care provider.  

## 2014-12-28 NOTE — Progress Notes (Signed)
Discharge order in SCM.  RN notified MD that patient's diastolic BP is still high at 100, MD states "that's okay, his normally runs high"  Discharge instructions given to patient without further questions.  IVs removed catheter intact x2.   Belongings returned to patient.

## 2014-12-29 NOTE — Discharge Summary (Signed)
[facility] - [place] at [facility]   PATIENT NAME: Alejandro Rush    MR#:  [id number]  DATE OF BIRTH:  [birth year]  DATE OF ADMISSION:  12/26/2014 ADMITTING PHYSICIAN: Milagros Loll, MD  DATE OF DISCHARGE: 12/28/2014 12:20 PM  PRIMARY CARE PHYSICIAN: No primary care provider on file.    ADMISSION DIAGNOSIS:  CVA (cerebral infarction) [I63.9] Hypertensive emergency [I10] Cerebral infarction due to unspecified mechanism [I63.9]  DISCHARGE DIAGNOSIS:  Active Problems:   CVA (cerebral infarction)   Hypertensive emergency  SECONDARY DIAGNOSIS:   Past Medical History  Diagnosis Date  . Hypertension   . Previous back surgery   . Tobacco abuse     HOSPITAL COURSE:  59 y.o. male with a known history of hypertension and tobacco abuse admitted for acute onset of right-sided numbness which was concerning acute stroke. Please see Dr Eddie North dictated [facility] for further details. Neurology c/s was obtained with Dr Pauletta Browns who recommended complete Neuro w/up including MRI brain, echo, carotid dopplers and were done. MRI confirmed small L thalamic infarct. He was strtaed on ASA + Statin. He didn't have much deficit and was evaluated by PT but was found not to need any skilled therapy.  He was D/C home on 11th Aug in Stable condition. He was agreeable with D/C plans. DISCHARGE CONDITIONS:  Stable CONSULTS OBTAINED:  Treatment Team:  Pauletta Browns, MD DRUG ALLERGIES:  No Known Allergies DISCHARGE MEDICATIONS:   Discharge Medication List as of 12/28/2014 10:56 AM    START taking these medications   Details  aspirin EC 81 MG EC tablet Take 1 tablet (81 mg total) by mouth daily., Starting 12/28/2014, Until Discontinued, Normal    atorvastatin (LIPITOR) 40 MG tablet Take 1 tablet (40 mg total) by mouth daily at 6 PM., Starting 12/28/2014, Until Discontinued, Normal    hydrALAZINE (APRESOLINE) 25 MG tablet Take 1 tablet (25 mg total) by mouth every 8 (eight)  hours., Starting 12/28/2014, Until Discontinued, Normal      CONTINUE these medications which have NOT CHANGED   Details  acetaminophen (TYLENOL) 500 MG tablet Take 500 mg by mouth every 6 (six) hours as needed., Until Discontinued, Historical Med    ibuprofen (ADVIL,MOTRIN) 200 MG tablet Take 200 mg by mouth every 6 (six) hours as needed., Until Discontinued, Historical Med    triamterene-hydrochlorothiazide (MAXZIDE-25) 37.5-25 MG per tablet Take 1 tablet by mouth daily., Until Discontinued, Historical Med       DISCHARGE INSTRUCTIONS:   DIET:  Cardiac diet DISCHARGE CONDITION:  Good ACTIVITY:  Activity as tolerated OXYGEN:  Home Oxygen: No.  Oxygen Delivery: room air DISCHARGE LOCATION:  home   If you experience worsening of your admission symptoms, develop shortness of breath, life threatening emergency, suicidal or homicidal thoughts you must seek medical attention immediately by calling 911 or calling your MD immediately  if symptoms less severe.  You Must read complete instructions/literature along with all the possible adverse reactions/side effects for all the Medicines you take and that have been prescribed to you. Take any new Medicines after you have completely understood and accpet all the possible adverse reactions/side effects.   Please note  You were cared for by a hospitalist during your hospital stay. If you have any questions about your discharge medications or the care you received while you were in the hospital after you are discharged, you can call the unit and asked to speak with the hospitalist on call if the hospitalist that took care  of you is not available. Once you are discharged, your primary care physician will handle any further medical issues. Please note that NO REFILLS for any discharge medications will be authorized once you are discharged, as it is imperative that you return to your primary care physician (or establish a relationship with a primary  care physician if you do not have one) for your aftercare needs so that they can reassess your need for medications and monitor your lab values.    On the day of Discharge: VITAL SIGNS:  Blood pressure 146/100, pulse 64, temperature 97.7 F (36.5 C), temperature source Oral, resp. rate 16, height 6' (1.829 m), weight 99.2 kg (218 lb 11.1 oz), SpO2 97 %. PHYSICAL EXAMINATION:  GENERAL:  59 y.o.-year-old patient lying in the bed with no acute distress.  EYES: Pupils equal, round, reactive to light and accommodation. No scleral icterus. Extraocular muscles intact.  HEENT: Head atraumatic, normocephalic. Oropharynx and nasopharynx clear.  NECK:  Supple, no jugular venous distention. No thyroid enlargement, no tenderness.  LUNGS: Normal breath sounds bilaterally, no wheezing, rales,rhonchi or crepitation. No use of accessory muscles of respiration.  CARDIOVASCULAR: S1, S2 normal. No murmurs, rubs, or gallops.  ABDOMEN: Soft, non-tender, non-distended. Bowel sounds present. No organomegaly or mass.  EXTREMITIES: No pedal edema, cyanosis, or clubbing.  NEUROLOGIC: Cranial nerves II through XII are intact. Muscle strength 5/5 in all extremities. Sensation intact. Gait not checked.  PSYCHIATRIC: The patient is alert and oriented x 3.  SKIN: No obvious rash, lesion, or ulcer.  DATA REVIEW:   CBC  Recent Labs Lab 12/26/14 1521  WBC 13.9*  HGB 15.6  HCT 46.0  PLT 360    Chemistries   Recent Labs Lab 12/26/14 1521  NA 139  K 3.5  CL 103  CO2 25  GLUCOSE 127*  BUN 16  CREATININE 1.22  CALCIUM 9.9  AST 34  ALT 28  ALKPHOS 81  BILITOT 0.4    Management plans discussed with the patient, family and they are in agreement.  CODE STATUS:   TOTAL TIME TAKING CARE OF THIS PATIENT: 55 minutes.    [facility], Kenner Lewan M.D on 12/29/2014 at 11:07 PM  Between 7am to 6pm - Pager - [phone]  After 6pm go to [email or web address] - password [email or web address] [facility]  [place] [facility]  Office   [phone]  CC: Primary care physician; No primary care provider on file.

## 2015-01-08 LAB — GLUCOSE, CAPILLARY: Glucose-Capillary: 129 mg/dL — ABNORMAL HIGH (ref 65–99)

## 2016-07-02 IMAGING — MR MR HEAD W/O CM
10 series · 41 of 48 positions shown · non-contrast
Comparison: Head CT without contrast 12/26/2014. Carotid Doppler
ultrasound 12/26/2014.

CLINICAL DATA: 59-year-old male with new onset right face arm and
lower extremity numbness and tingling since yesterday. Symptoms
persist.

EXAM:
MRI HEAD WITHOUT CONTRAST
TECHNIQUE: Multiplanar, multiecho pulse sequences of the brain and surrounding
structures were obtained without intravenous contrast.

[Series 2: T1 · sagittal · 5.0mm · 0.45mm/px · 3 of 25 slices shown (1 of 2)]
[im 1/25]
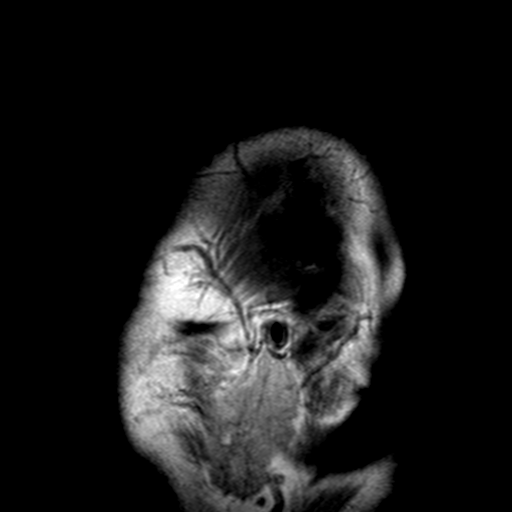
[im 13/25]
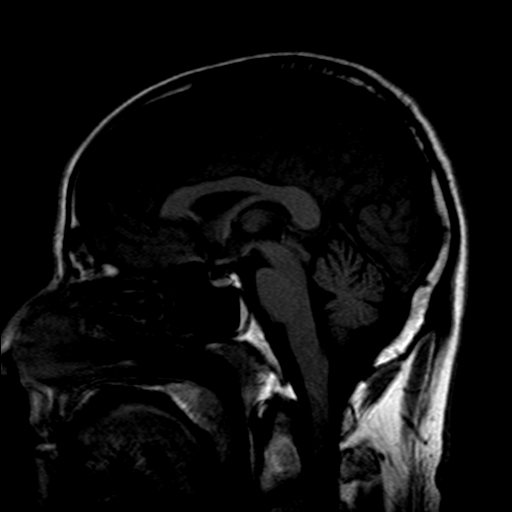
[im 25/25]
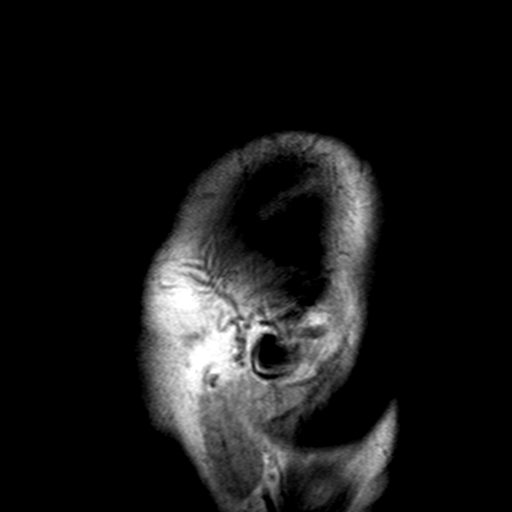

[Series 7: T2 · axial · 5.0mm · 0.45mm/px · z∈[-44,+112]mm · 3 of 25 slices shown (1 of 3)]
[im 1/25]
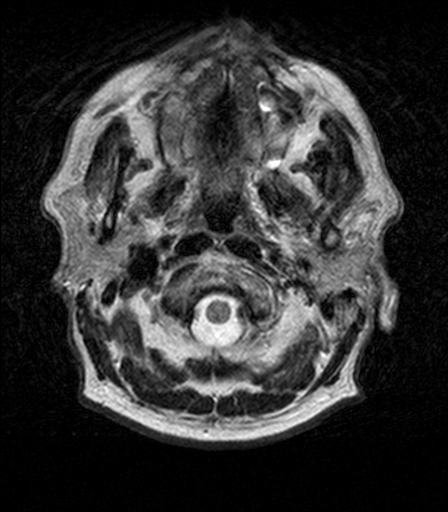
[im 13/25]
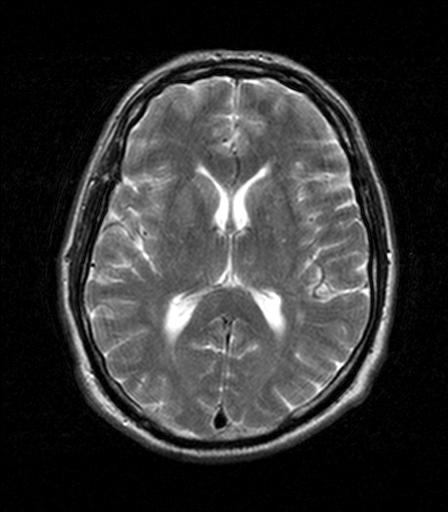
[im 25/25]
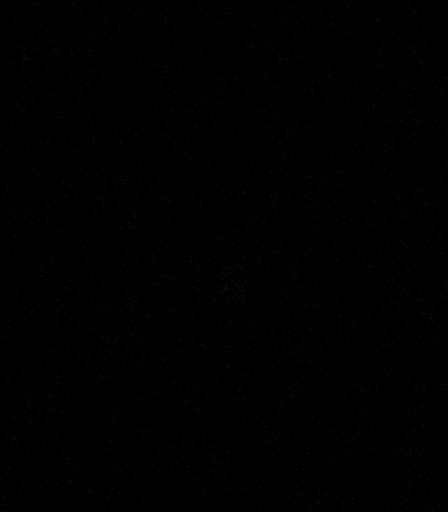

[Series 8: FLAIR · axial · 5.0mm · 0.90mm/px · z∈[-44,+112]mm · 4 of 25 slices shown]
[im 1/25]
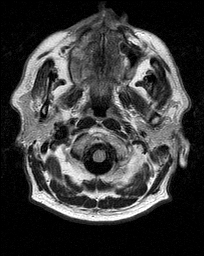
[im 9/25]
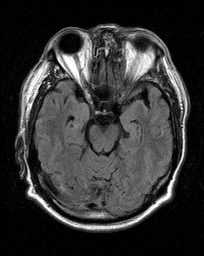
[im 17/25]
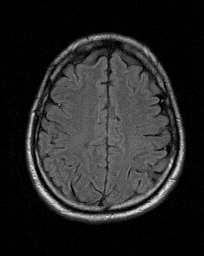
[im 25/25]
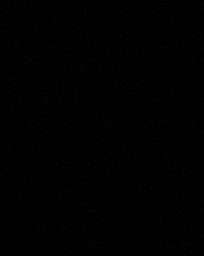

[Series 9: DWI · axial · 4.0mm · 0.94mm/px · z∈[-44,+112]mm · 6 of 39 slices shown (1 of 2)]
[im 1/39]
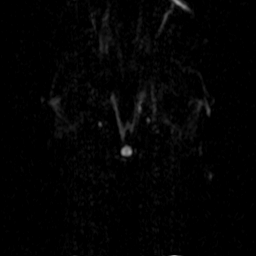
[im 8/39]
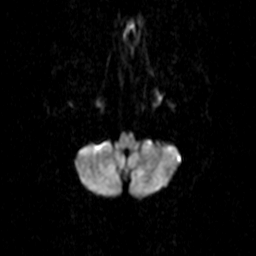
[im 16/39]
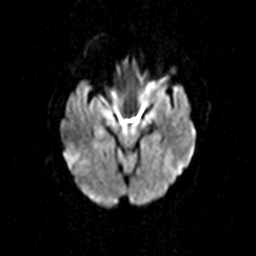
[im 23/39]
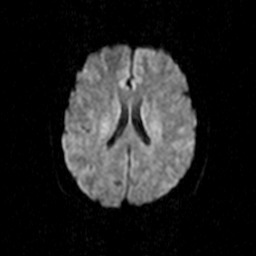
[im 31/39]
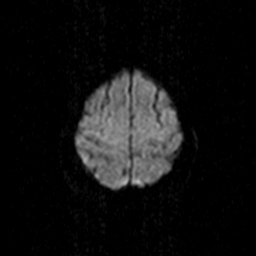
[im 39/39]
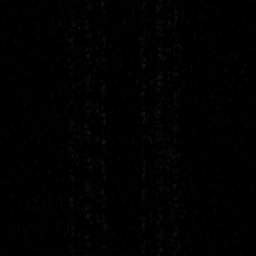

[Series 10: ADC · axial · 4.0mm · 0.94mm/px · z∈[-44,+112]mm · 6 of 40 slices shown (1 of 2)]
[im 1/40]
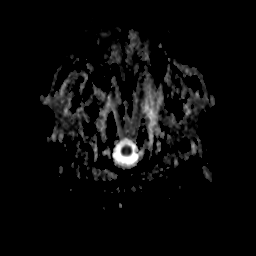
[im 8/40]
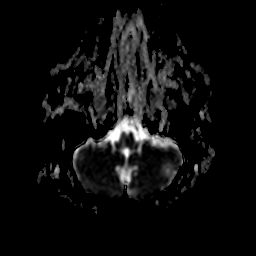
[im 16/40]
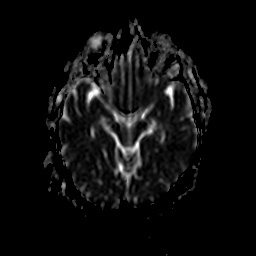
[im 24/40]
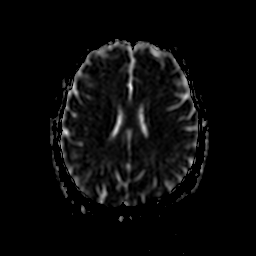
[im 32/40]
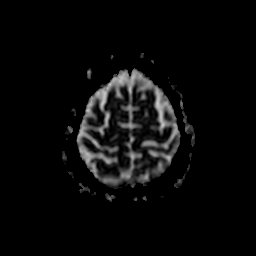
[im 40/40]
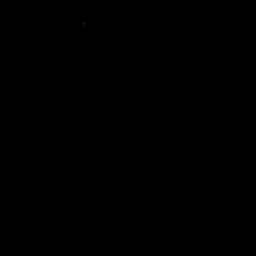

[Series 11: DWI · coronal · 5.0mm · 1.80mm/px · 5 of 31 slices shown (2 of 2)]
[im 1/31]
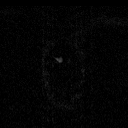
[im 8/31]
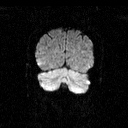
[im 16/31]
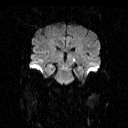
[im 23/31]
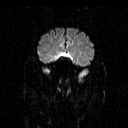
[im 31/31]
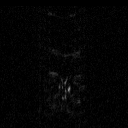

[Series 12: ADC · coronal · 5.0mm · 1.80mm/px · 5 of 37 slices shown (2 of 2)]
[im 1/37]
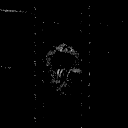
[im 10/37]
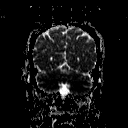
[im 19/37]
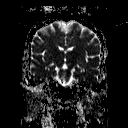
[im 28/37]
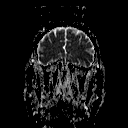
[im 37/37]
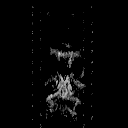

[Series 13: T2 · axial · 5.0mm · 0.45mm/px · z∈[-44,+112]mm · 4 of 25 slices shown (2 of 3)]
[im 1/25]
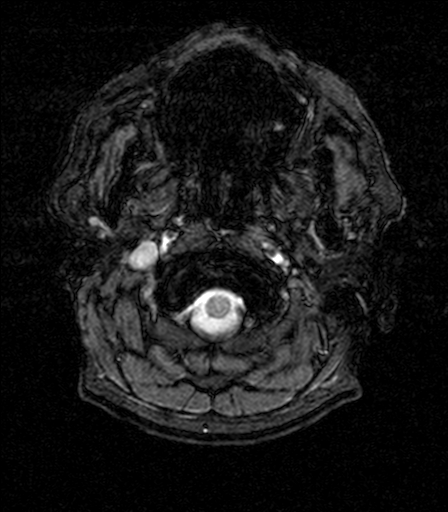
[im 9/25]
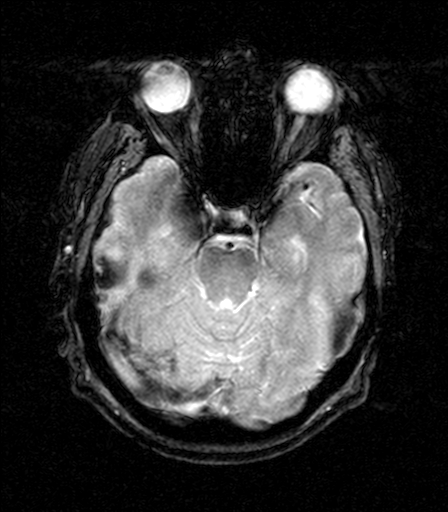
[im 17/25]
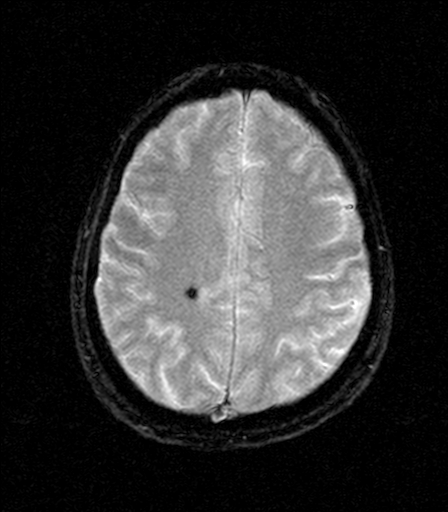
[im 25/25]
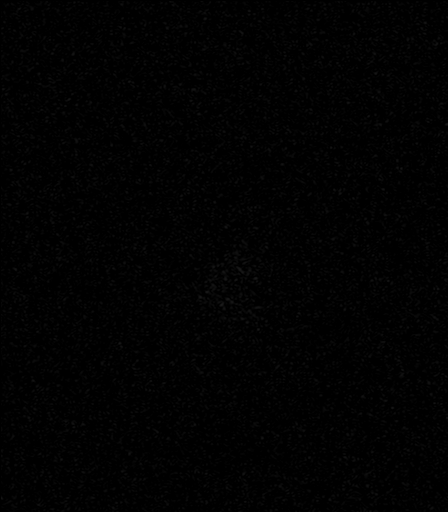

[Series 14: T1 · axial · 3.0mm · 0.45mm/px · 1 of 52 slices shown (2 of 2)]
[im 1/52]
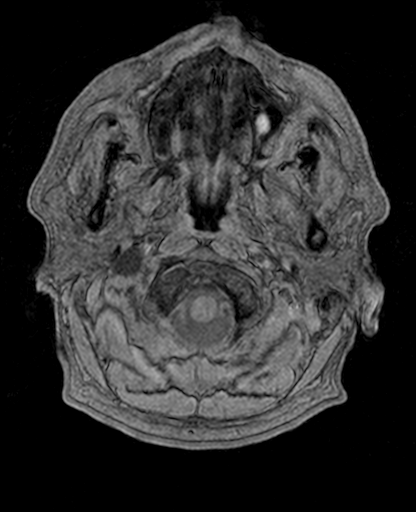

[Series 15: T2 · coronal · 5.0mm · 0.45mm/px · 4 of 29 slices shown (3 of 3)]
[im 1/29]
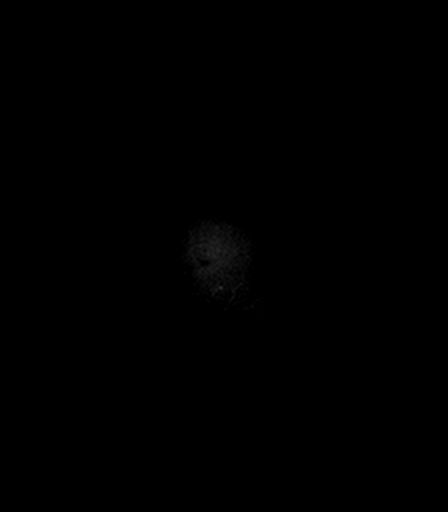
[im 10/29]
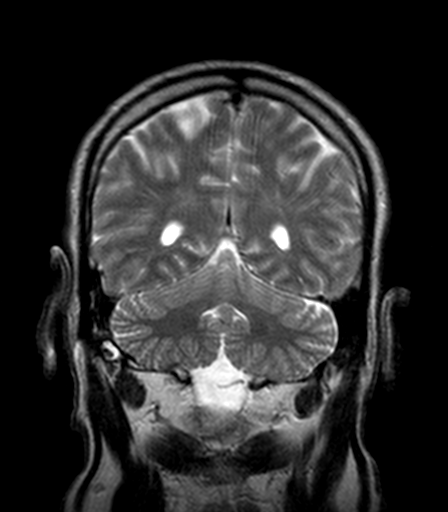
[im 19/29]
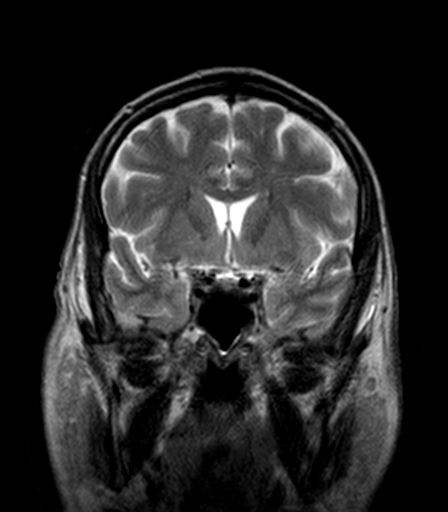
[im 29/29]
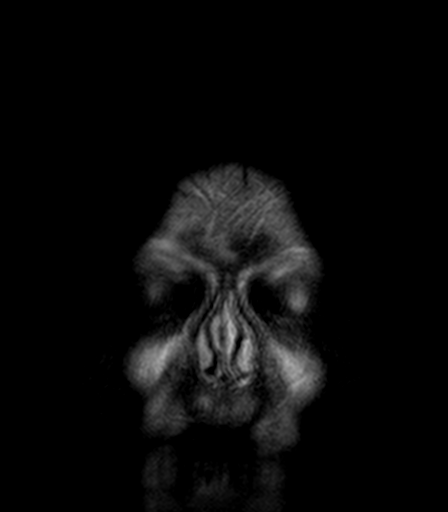

[41 of 48 positions shown; findings below may reference images not displayed]

FINDINGS: Cerebral volume is normal. Round 7 mm focus of restricted diffusion
within the left thalamus (series 9, image 19). Mild T2 and FLAIR
hyperintensity. No associated hemorrhage or mass effect.

No other restricted diffusion. Major intracranial vascular flow
voids are within normal limits.

Chronic micro hemorrhage in the right corona radiata (series 13,
image 17). Otherwise minimal to mild for age nonspecific cerebral
white matter T2 and FLAIR hyperintensity, mostly in the periatrial
regions. Outside of the left thalamic findings negative deep gray
matter nuclei, brainstem, and cerebellum.

Visible internal auditory structures appear normal. Mastoids are
clear. Mild paranasal sinus mucosal thickening and trace right
sphenoid sinus fluid level. Negative orbit and scalp soft tissues.
Negative visualized cervical spine. Normal bone marrow signal.
IMPRESSION: 1. Small acute lacunar infarct in the left thalamus. No hemorrhage
or mass effect.
2. Otherwise mild for age signal changes compatible with chronic
small vessel disease.
3. Stable mild sphenoid sinus inflammation.

## 2017-04-02 ENCOUNTER — Other Ambulatory Visit: Payer: Self-pay | Admitting: Adult Health

## 2017-04-03 ENCOUNTER — Other Ambulatory Visit: Payer: Self-pay | Admitting: Adult Health

## 2017-04-03 DIAGNOSIS — R42 Dizziness and giddiness: Secondary | ICD-10-CM

## 2017-04-03 DIAGNOSIS — R0989 Other specified symptoms and signs involving the circulatory and respiratory systems: Secondary | ICD-10-CM

## 2017-04-08 ENCOUNTER — Ambulatory Visit
Admission: RE | Admit: 2017-04-08 | Discharge: 2017-04-08 | Disposition: A | Discharge status: Home / Self Care | Payer: Self-pay | Source: Ambulatory Visit | Attending: Adult Health | Admitting: Adult Health

## 2017-04-08 DIAGNOSIS — R0989 Other specified symptoms and signs involving the circulatory and respiratory systems: Secondary | ICD-10-CM | POA: Insufficient documentation

## 2017-04-08 DIAGNOSIS — R42 Dizziness and giddiness: Secondary | ICD-10-CM | POA: Insufficient documentation

## 2017-04-08 DIAGNOSIS — I6523 Occlusion and stenosis of bilateral carotid arteries: Secondary | ICD-10-CM | POA: Insufficient documentation

## 2020-08-06 ENCOUNTER — Encounter: Payer: Self-pay | Admitting: *Deleted

## 2021-02-20 ENCOUNTER — Encounter: Payer: Self-pay | Admitting: Family Medicine

## 2022-10-06 ENCOUNTER — Other Ambulatory Visit: Payer: Self-pay | Admitting: Family Medicine

## 2022-10-06 DIAGNOSIS — Z122 Encounter for screening for malignant neoplasm of respiratory organs: Secondary | ICD-10-CM

## 2022-10-20 ENCOUNTER — Other Ambulatory Visit: Payer: Self-pay | Admitting: Family Medicine

## 2022-10-20 ENCOUNTER — Ambulatory Visit
Admission: RE | Admit: 2022-10-20 | Discharge: 2022-10-20 | Disposition: A | Discharge status: Home / Self Care | Payer: 59 | Source: Ambulatory Visit | Attending: Family Medicine | Admitting: Family Medicine

## 2022-10-20 DIAGNOSIS — F17210 Nicotine dependence, cigarettes, uncomplicated: Secondary | ICD-10-CM | POA: Insufficient documentation

## 2022-10-20 DIAGNOSIS — I700 Atherosclerosis of aorta: Secondary | ICD-10-CM | POA: Diagnosis not present

## 2022-10-20 DIAGNOSIS — J439 Emphysema, unspecified: Secondary | ICD-10-CM | POA: Diagnosis not present

## 2022-10-20 DIAGNOSIS — Z122 Encounter for screening for malignant neoplasm of respiratory organs: Secondary | ICD-10-CM | POA: Insufficient documentation

## 2022-11-03 ENCOUNTER — Encounter: Payer: Self-pay | Admitting: Cardiology

## 2022-11-03 ENCOUNTER — Ambulatory Visit: Payer: 59 | Attending: Cardiology | Admitting: Cardiology

## 2022-11-03 ENCOUNTER — Other Ambulatory Visit
Admission: RE | Admit: 2022-11-03 | Discharge: 2022-11-03 | Disposition: A | Discharge status: Home / Self Care | Payer: 59 | Source: Ambulatory Visit | Attending: Cardiology | Admitting: Cardiology

## 2022-11-03 ENCOUNTER — Other Ambulatory Visit: Payer: Self-pay

## 2022-11-03 VITALS — BP 150/84 | HR 62 | Ht 72.0 in | Wt 230.4 lb

## 2022-11-03 DIAGNOSIS — F17200 Nicotine dependence, unspecified, uncomplicated: Secondary | ICD-10-CM | POA: Diagnosis not present

## 2022-11-03 DIAGNOSIS — I2510 Atherosclerotic heart disease of native coronary artery without angina pectoris: Secondary | ICD-10-CM

## 2022-11-03 DIAGNOSIS — E782 Mixed hyperlipidemia: Secondary | ICD-10-CM

## 2022-11-03 DIAGNOSIS — R072 Precordial pain: Secondary | ICD-10-CM

## 2022-11-03 DIAGNOSIS — I10 Essential (primary) hypertension: Secondary | ICD-10-CM | POA: Diagnosis not present

## 2022-11-03 LAB — BASIC METABOLIC PANEL
Anion gap: 9 (ref 5–15)
BUN: 24 mg/dL — ABNORMAL HIGH (ref 8–23)
CO2: 24 mmol/L (ref 22–32)
Calcium: 9.4 mg/dL (ref 8.9–10.3)
Chloride: 104 mmol/L (ref 98–111)
Creatinine, Ser: 1.28 mg/dL — ABNORMAL HIGH (ref 0.61–1.24)
GFR, Estimated: >60 mL/min (ref >60)
Glucose, Bld: 113 mg/dL — ABNORMAL HIGH (ref 70–99)
Potassium: 3.8 mmol/L (ref 3.5–5.1)
Sodium: 137 mmol/L (ref 135–145)

## 2022-11-03 LAB — LIPID PANEL
Cholesterol: 195 mg/dL (ref 0–200)
HDL: 43 mg/dL (ref >40)
LDL Cholesterol: 118 mg/dL — ABNORMAL HIGH (ref 0–99)
Total CHOL/HDL Ratio: 4.5 RATIO
Triglycerides: 168 mg/dL — ABNORMAL HIGH (ref <150)
VLDL: 34 mg/dL (ref 0–40)

## 2022-11-03 MED ORDER — METOPROLOL TARTRATE 100 MG PO TABS: 100.0000 mg | ORAL_TABLET | Freq: Once | ORAL | 0 refills | Status: DC

## 2022-11-03 NOTE — Progress Notes (Signed)
Cardiology Office Note:    Date:  11/03/2022   ID:  Cecille Rubin, DOB 10-04-1955, MRN [id number]  PCP:  Pediatrics, [facility] Providers Cardiologist:  Debbe Odea, MD     Referring MD: Alease Medina, MD   Chief Complaint  Patient presents with   New Patient (Initial Visit)    Referred for Aortic Atherosclerosis seen on recent CT.    History of Present Illness:    Alejandro Rush is a 67 y.o. male with a hx of CAD (LAD calcifications on chest CT ), hypertension, hyperlipidemia, CVA 2016, current smoker x 30+ years who presents due to coronary calcifications.  Patient had a chest CT lung cancer screening 10/20/2022 showing LAD calcifications.  States having occasional chest pain not usually associated with exertion.  Had a stroke in 2016 due to elevated blood pressures with systolics in the 200s.  He still smokes, endorses high salt diet.  Compliant with medications as prescribed, systolic blood pressure at home in the 140s.  Past Medical History:  Diagnosis Date   History of stroke 2016   Hyperlipidemia    Hypertension    Hypothyroid    Previous back surgery    Tobacco abuse     Past Surgical History:  Procedure Laterality Date   BACK SURGERY      Current Medications: Current Meds  Medication Sig   acetaminophen (TYLENOL) 500 MG tablet Take 500 mg by mouth every 6 (six) hours as needed.   amLODipine (NORVASC) 10 MG tablet Take 10 mg by mouth daily.   aspirin EC 81 MG EC tablet Take 1 tablet (81 mg total) by mouth daily.   atorvastatin (LIPITOR) 40 MG tablet Take 1 tablet (40 mg total) by mouth daily at 6 PM.   doxazosin (CARDURA) 8 MG tablet Take 8 mg by mouth daily.   hydrochlorothiazide (HYDRODIURIL) 25 MG tablet Take 25 mg by mouth daily.   ibuprofen (ADVIL,MOTRIN) 200 MG tablet Take 200 mg by mouth every 6 (six) hours as needed.   levothyroxine (SYNTHROID) 50 MCG tablet Take 50 mcg by mouth daily.   losartan  (COZAAR) 100 MG tablet Take 100 mg by mouth daily.   meloxicam (MOBIC) 15 MG tablet Take 15 mg by mouth daily.   Multiple Vitamin (MULTIVITAMIN) tablet Take 1 tablet by mouth daily.   potassium chloride (KLOR-CON) 10 MEQ tablet Take 10 mEq by mouth daily.     Allergies:   Patient has no known allergies.   Social History   Socioeconomic History   Marital status: Single    Spouse name: Not on file   Number of children: Not on file   Years of education: Not on file   Highest education level: Not on file  Occupational History   Not on file  Tobacco Use   Smoking status: Every Day    Packs/day: 1.00    Years: 30.00    Additional pack years: 0.00    Total pack years: 30.00    Types: Cigarettes   Smokeless tobacco: Not on file  Vaping Use   Vaping Use: Never used  Substance and Sexual Activity   Alcohol use: Yes    Comment: daily consumption   Drug use: Never   Sexual activity: Not on file  Other Topics Concern   Not on file  Social History Narrative   Not on file   Social Determinants of Health   Financial Resource Strain: Not on file  Food Insecurity: Not  on file  Transportation Needs: Not on file  Physical Activity: Not on file  Stress: Not on file  Social Connections: Not on file     Family History: The patient's family history includes Diabetes in his mother; Heart attack in his maternal uncle and maternal uncle.  ROS:   Please see the history of present illness.     All other systems reviewed and are negative.  EKGs/Labs/Other Studies Reviewed:    The following studies were reviewed today:   EKG:  EKG is  ordered today.  The ekg ordered today demonstrates normal sinus rhythm, normal ECG.  Recent Labs: No results found for requested labs within last 365 days.  Recent Lipid Panel    Component Value Date/Time   CHOL 221 (H) 12/27/2014 1027   TRIG 255 (H) 12/27/2014 1027   HDL 42 12/27/2014 1027   CHOLHDL 5.3 12/27/2014 1027   VLDL 51 (H) 12/27/2014  1027   LDLCALC 128 (H) 12/27/2014 1027     Risk Assessment/Calculations:     HYPERTENSION CONTROL Vitals:   11/03/22 0910 11/03/22 0917  BP: (!) 142/80 (!) 150/84    The patient's blood pressure is elevated above target today.  In order to address the patient's elevated BP: Blood pressure will be monitored at home to determine if medication changes need to be made.            Physical Exam:    VS:  BP (!) 150/84 (BP Location: Right Arm, Patient Position: Sitting, Cuff Size: Normal)   Pulse 62   Ht 6' (1.829 m)   Wt 230 lb 6.4 oz (104.5 kg)   SpO2 99%   BMI 31.25 kg/m     Wt Readings from Last 3 Encounters:  11/03/22 230 lb 6.4 oz (104.5 kg)  10/20/22 238 lb (108 kg)  12/28/14 218 lb 11.1 oz (99.2 kg)     GEN:  Well nourished, well developed in no acute distress HEENT: Normal NECK: No JVD; No carotid bruits CARDIAC: RRR, no murmurs, rubs, gallops RESPIRATORY:  Clear to auscultation without rales, wheezing or rhonchi  ABDOMEN: Soft, non-tender, non-distended MUSCULOSKELETAL:  No edema; No deformity  SKIN: Warm and dry NEUROLOGIC:  Alert and oriented x 3 PSYCHIATRIC:  Normal affect   ASSESSMENT:    1. Precordial pain   2. Coronary artery disease involving native coronary artery of native heart, unspecified whether angina present   3. Mixed hyperlipidemia   4. Primary hypertension   5. Smoking    PLAN:    In order of problems listed above:  Chest pain, risk factors hyperlipidemia, current smoker, hypertension.  Get echocardiogram, get coronary CTA. CAD(LAD calcifications on chest CT).  Continue aspirin, Lipitor.  Obtain echocardiogram and coronary CT as above. Hyperlipidemia, continue Lipitor 40 mg daily.  Obtain fasting lipid profile. Hypertension, BP elevated, low-salt diet, smoking cessation emphasized.  Continue amlodipine 10, losartan 100 mg daily, hydralazine.  Consider Coreg at follow-up visit if BP still elevated. Smoking, cessation  advised.  Follow-up after cardiac testing      Medication Adjustments/Labs and Tests Ordered: Current medicines are reviewed at length with the patient today.  Concerns regarding medicines are outlined above.  Orders Placed This Encounter  Procedures   CT CORONARY MORPH W/CTA COR W/SCORE W/CA W/CM &/OR WO/CM   Basic Metabolic Panel (BMET)   Lipid panel   EKG 12-Lead   ECHOCARDIOGRAM COMPLETE   No orders of the defined types were placed in this encounter.   Patient Instructions  Medication Instructions:   Your physician recommends that you continue on your current medications as directed. Please refer to the Current Medication list given to you today.  *If you need a refill on your cardiac medications before your next appointment, please call your pharmacy*   Lab Work:  Your physician recommends you go to the medical mall for labs today - LIPID & BMP   If you have labs (blood work) drawn today and your tests are completely normal, you will receive your results only by: MyChart Message (if you have MyChart) OR A paper copy in the mail If you have any lab test that is abnormal or we need to change your treatment, we will call you to review the results.   Testing/Procedures:  Your physician has requested that you have an echocardiogram. Echocardiography is a painless test that uses sound waves to create images of your heart. It provides your doctor with information about the size and shape of your heart and how well your heart's chambers and valves are working. This procedure takes approximately one hour. There are no restrictions for this procedure. Please do NOT wear cologne, perfume, aftershave, or lotions (deodorant is allowed). Please arrive 15 minutes prior to your appointment time.     Your cardiac CT will be scheduled at   [facility] [address] [place], [place] [address] [phone]  OR   [facility] [address] [place], [place] [address] [phone]  If scheduled at [facility] or [facility], please arrive 15 mins early for check-in and test prep.   Please follow these instructions carefully (unless otherwise directed):  Hold all erectile dysfunction medications at least 3 days (72 hrs) prior to test. (Ie viagra, cialis, sildenafil, tadalafil, etc) We will administer nitroglycerin during this exam.   On the Night Before the Test: Be sure to Drink plenty of water. Do not consume any caffeinated/decaffeinated beverages or chocolate 12 hours prior to your test. Do not take any antihistamines 12 hours prior to your test.  On the Day of the Test: Drink plenty of water until 1 hour prior to the test. Do not eat any food 1 hour prior to test. You may take your regular medications prior to the test.  Take metoprolol (Lopressor) two hours prior to test. If you take Hydrochlorothiazide please HOLD on the morning of the test. FEMALES- please wear underwire-free bra if available, avoid dresses & tight clothing  After the Test: Drink plenty of water. After receiving IV contrast, you may experience a mild flushed feeling. This is normal. On occasion, you may experience a mild rash up to 24 hours after the test. This is not dangerous. If this occurs, you can take Benadryl 25 mg and increase your fluid intake. If you experience trouble breathing, this can be serious. If it is severe call 911 IMMEDIATELY. If it is mild, please call our office.  For non-scheduling related questions, please contact the cardiac imaging nurse navigator should you have any questions/concerns: Rockwell Alexandria, Cardiac Imaging Nurse Navigator Larey Brick, Cardiac Imaging Nurse Navigator [facility] Direct Office Dial: [phone]   For scheduling needs, including cancellations and rescheduling, please call [place],  [phone].  Follow-Up: At [facility], you and your health needs are our priority.  As part of our continuing mission to provide you with exceptional heart care, we have created designated Provider Care Teams.  These Care  Teams include your primary Cardiologist (physician) and Advanced Practice Providers (APPs -  Physician Assistants and Nurse Practitioners) who all work together to provide you with the care you need, when you need it.  We recommend signing up for the patient portal called "MyChart".  Sign up information is provided on this After Visit Summary.  MyChart is used to connect with patients for Virtual Visits (Telemedicine).  Patients are able to view lab/test results, encounter notes, upcoming appointments, etc.  Non-urgent messages can be sent to your provider as well.   To learn more about what you can do with MyChart, go to [email or web address].    Your next appointment:    8- 10 weeks  Provider:   You may see Debbe Odea, MD or one of the following Advanced Practice Providers on your designated Care Team:   Nicolasa Ducking, NP Eula Listen, PA-C Cadence Fransico Michael, PA-C Charlsie Quest, NP    Signed, Debbe Odea, MD  11/03/2022 10:28 AM    [facility]

## 2022-11-03 NOTE — Patient Instructions (Signed)
Medication Instructions:   Your physician recommends that you continue on your current medications as directed. Please refer to the Current Medication list given to you today.  *If you need a refill on your cardiac medications before your next appointment, please call your pharmacy*   Lab Work:  Your physician recommends you go to the medical mall for labs today - LIPID & BMP   If you have labs (blood work) drawn today and your tests are completely normal, you will receive your results only by: MyChart Message (if you have MyChart) OR A paper copy in the mail If you have any lab test that is abnormal or we need to change your treatment, we will call you to review the results.   Testing/Procedures:  Your physician has requested that you have an echocardiogram. Echocardiography is a painless test that uses sound waves to create images of your heart. It provides your doctor with information about the size and shape of your heart and how well your heart's chambers and valves are working. This procedure takes approximately one hour. There are no restrictions for this procedure. Please do NOT wear cologne, perfume, aftershave, or lotions (deodorant is allowed). Please arrive 15 minutes prior to your appointment time.     Your cardiac CT will be scheduled at   [facility] [address] [place], [place] [address] [phone]  OR   [facility] [address] [place], [place] [address] [phone]  If scheduled at [facility] or [facility], please arrive 15 mins early for check-in and test prep.   Please follow these instructions carefully (unless otherwise directed):  Hold all erectile dysfunction medications at least 3 days (72 hrs) prior to test. (Ie viagra, cialis, sildenafil, tadalafil, etc) We will administer nitroglycerin during this exam.   On the Night  Before the Test: Be sure to Drink plenty of water. Do not consume any caffeinated/decaffeinated beverages or chocolate 12 hours prior to your test. Do not take any antihistamines 12 hours prior to your test.  On the Day of the Test: Drink plenty of water until 1 hour prior to the test. Do not eat any food 1 hour prior to test. You may take your regular medications prior to the test.  Take metoprolol (Lopressor) two hours prior to test. If you take Hydrochlorothiazide please HOLD on the morning of the test. FEMALES- please wear underwire-free bra if available, avoid dresses & tight clothing  After the Test: Drink plenty of water. After receiving IV contrast, you may experience a mild flushed feeling. This is normal. On occasion, you may experience a mild rash up to 24 hours after the test. This is not dangerous. If this occurs, you can take Benadryl 25 mg and increase your fluid intake. If you experience trouble breathing, this can be serious. If it is severe call 911 IMMEDIATELY. If it is mild, please call our office.  For non-scheduling related questions, please contact the cardiac imaging nurse navigator should you have any questions/concerns: Rockwell Alexandria, Cardiac Imaging Nurse Navigator Larey Brick, Cardiac Imaging Nurse Navigator  [facility] Direct Office Dial: [phone]   For scheduling needs, including cancellations and rescheduling, please call [place], [phone].  Follow-Up: At [facility], you and your health needs are our priority.  As part of our continuing mission to provide you with exceptional heart care, we have created designated Provider Care Teams.  These Care Teams  include your primary Cardiologist (physician) and Advanced Practice Providers (APPs -  Physician Assistants and Nurse Practitioners) who all work together to provide you with the care you need, when you need it.  We recommend signing up for the patient portal  called "MyChart".  Sign up information is provided on this After Visit Summary.  MyChart is used to connect with patients for Virtual Visits (Telemedicine).  Patients are able to view lab/test results, encounter notes, upcoming appointments, etc.  Non-urgent messages can be sent to your provider as well.   To learn more about what you can do with MyChart, go to [email or web address].    Your next appointment:    8- 10 weeks  Provider:   You may see Debbe Odea, MD or one of the following Advanced Practice Providers on your designated Care Team:   Nicolasa Ducking, NP Eula Listen, PA-C Cadence Fransico Michael, PA-C Charlsie Quest, NP

## 2022-11-07 ENCOUNTER — Telehealth: Payer: Self-pay | Admitting: Cardiology

## 2022-11-07 ENCOUNTER — Other Ambulatory Visit: Payer: Self-pay

## 2022-11-07 MED ORDER — ATORVASTATIN CALCIUM 80 MG PO TABS: 80.0000 mg | ORAL_TABLET | Freq: Every day | ORAL | 0 refills | Status: DC

## 2022-11-07 NOTE — Telephone Encounter (Signed)
New Message:    Patient says he is scheduled for an Echo on 11-10-22. He said he received instructions for him to take his Metoprolol before his test. Patient says he is not on Metoprolol.

## 2022-11-07 NOTE — Telephone Encounter (Signed)
Patient has been made aware to take the Metoprolol two hours before the CT and not the echo.

## 2022-11-10 ENCOUNTER — Ambulatory Visit: Payer: 59 | Attending: Cardiology

## 2022-11-10 DIAGNOSIS — R072 Precordial pain: Secondary | ICD-10-CM | POA: Diagnosis not present

## 2022-11-10 LAB — ECHOCARDIOGRAM COMPLETE
Area-P 1/2: 3.85 cm2
S' Lateral: 4.5 cm

## 2022-11-12 DIAGNOSIS — R600 Localized edema: Secondary | ICD-10-CM | POA: Diagnosis not present

## 2022-11-26 ENCOUNTER — Telehealth (HOSPITAL_COMMUNITY): Payer: Self-pay | Admitting: *Deleted

## 2022-11-26 NOTE — Telephone Encounter (Signed)
Reaching out to patient to offer assistance regarding upcoming cardiac imaging study; pt verbalizes understanding of appt date/time, parking situation and where to check in, pre-test NPO status and medications ordered, and verified current allergies; name and call back number provided for further questions should they arise  Larey Brick RN Navigator Cardiac Imaging [facility] [phone] office [phone] cell  Confirmed with patient that his HR is 75bpm. He is to take 100mg  metoprolol tartrate two hours prior to his cardiac CT scan.

## 2022-11-26 NOTE — Telephone Encounter (Signed)
Attempted to call patient regarding upcoming cardiac CT appointment. °Left message on voicemail with name and callback number ° °Yaminah Clayborn RN Navigator Cardiac Imaging ° [facility] °[phone] Office °[phone] Cell ° °

## 2022-11-27 ENCOUNTER — Ambulatory Visit
Admission: RE | Admit: 2022-11-27 | Discharge: 2022-11-27 | Disposition: A | Discharge status: Home / Self Care | Payer: 59 | Source: Ambulatory Visit | Attending: Cardiology | Admitting: Cardiology

## 2022-11-27 DIAGNOSIS — R072 Precordial pain: Secondary | ICD-10-CM | POA: Diagnosis not present

## 2022-11-27 MED ORDER — SODIUM CHLORIDE 0.9 % IV BOLUS
250.0000 mL | Freq: Once | INTRAVENOUS | Status: AC
  Administered 2022-11-27: 150 mL via INTRAVENOUS

## 2022-11-27 MED ORDER — NITROGLYCERIN 0.4 MG SL SUBL
0.8000 mg | SUBLINGUAL_TABLET | Freq: Once | SUBLINGUAL | Status: AC
  Administered 2022-11-27: 0.8 mg via SUBLINGUAL

## 2022-11-27 MED ORDER — IOHEXOL 350 MG/ML SOLN
100.0000 mL | Freq: Once | INTRAVENOUS | Status: AC | PRN
  Administered 2022-11-27: 100 mL via INTRAVENOUS

## 2022-11-27 NOTE — Progress Notes (Signed)
Patient tolerated CT well. Drank water after. Vital signs stable encourage to drink water throughout day.Reasons explained and verbalized understanding. Ambulated steady gait.  

## 2023-01-08 ENCOUNTER — Ambulatory Visit: Payer: 59 | Attending: Cardiology | Admitting: Cardiology

## 2023-01-08 ENCOUNTER — Encounter: Payer: Self-pay | Admitting: Cardiology

## 2023-01-08 VITALS — BP 132/74 | HR 87 | Ht 72.0 in | Wt 228.8 lb

## 2023-01-08 DIAGNOSIS — I2510 Atherosclerotic heart disease of native coronary artery without angina pectoris: Secondary | ICD-10-CM

## 2023-01-08 DIAGNOSIS — F17200 Nicotine dependence, unspecified, uncomplicated: Secondary | ICD-10-CM | POA: Diagnosis not present

## 2023-01-08 DIAGNOSIS — I10 Essential (primary) hypertension: Secondary | ICD-10-CM

## 2023-01-08 DIAGNOSIS — E782 Mixed hyperlipidemia: Secondary | ICD-10-CM | POA: Diagnosis not present

## 2023-01-08 NOTE — Progress Notes (Signed)
Cardiology Office Note:    Date:  01/08/2023   ID:  Alejandro Rush, DOB 1955-10-06, MRN [id number]  PCP:  Pediatrics, [facility] Providers Cardiologist:  Debbe Odea, MD     Referring MD: Pediatrics, [facility]   Chief Complaint  Patient presents with   Follow-up    Discuss cardiac testing.  Patient denies new or acute cardiac problems/concerns today.      History of Present Illness:    Alejandro Rush is a 67 y.o. male with a hx of CAD (25% LAD, minimal RCA stenosis), hypertension, hyperlipidemia, CVA 2016, current smoker x 30+ years who presents for follow-up.  Previously seen due to coronary calcifications and some chest discomfort.  Echo and coronary CTA was ordered to evaluate obstructive CAD.  He still smokes.  Previous primary care physician's practice was closed, has an appointment with new PCP in 2 months.  Patient had a chest CT lung cancer screening 10/20/2022 showing LAD calcifications.   Past Medical History:  Diagnosis Date   History of stroke 2016   Hyperlipidemia    Hypertension    Hypothyroid    Previous back surgery    Tobacco abuse     Past Surgical History:  Procedure Laterality Date   BACK SURGERY      Current Medications: Current Meds  Medication Sig   acetaminophen (TYLENOL) 500 MG tablet Take 500 mg by mouth every 6 (six) hours as needed.   amLODipine (NORVASC) 10 MG tablet Take 10 mg by mouth daily.   aspirin EC 81 MG EC tablet Take 1 tablet (81 mg total) by mouth daily.   atorvastatin (LIPITOR) 80 MG tablet Take 1 tablet (80 mg total) by mouth daily at 6 PM.   doxazosin (CARDURA) 8 MG tablet Take 8 mg by mouth daily.   hydrochlorothiazide (HYDRODIURIL) 25 MG tablet Take 25 mg by mouth daily.   ibuprofen (ADVIL,MOTRIN) 200 MG tablet Take 200 mg by mouth every 6 (six) hours as needed.   levothyroxine (SYNTHROID) 50 MCG tablet Take 50 mcg by mouth daily.   losartan (COZAAR) 100 MG tablet Take 100  mg by mouth daily.   meloxicam (MOBIC) 15 MG tablet Take 15 mg by mouth daily.   Multiple Vitamin (MULTIVITAMIN) tablet Take 1 tablet by mouth daily.   potassium chloride (KLOR-CON) 10 MEQ tablet Take 10 mEq by mouth daily.     Allergies:   Patient has no known allergies.   Social History   Socioeconomic History   Marital status: Single    Spouse name: Not on file   Number of children: Not on file   Years of education: Not on file   Highest education level: Not on file  Occupational History   Not on file  Tobacco Use   Smoking status: Every Day    Current packs/day: 1.00    Average packs/day: 1 pack/day for 30.0 years (30.0 ttl pk-yrs)    Types: Cigarettes   Smokeless tobacco: Not on file  Vaping Use   Vaping status: Never Used  Substance and Sexual Activity   Alcohol use: Yes    Comment: daily consumption   Drug use: Never   Sexual activity: Not on file  Other Topics Concern   Not on file  Social History Narrative   Not on file   Social Determinants of Health   Financial Resource Strain: Not on file  Food Insecurity: Not on file  Transportation Needs: Not on file  Physical Activity:  Not on file  Stress: Not on file  Social Connections: Not on file     Family History: The patient's family history includes Diabetes in his mother; Heart attack in his maternal uncle and maternal uncle.  ROS:   Please see the history of present illness.     All other systems reviewed and are negative.  EKGs/Labs/Other Studies Reviewed:    The following studies were reviewed today:   EKG:  EKG not ordered today.    Recent Labs: 11/03/2022: BUN 24; Creatinine, Ser 1.28; Potassium 3.8; Sodium 137  Recent Lipid Panel    Component Value Date/Time   CHOL 195 11/03/2022 1017   TRIG 168 (H) 11/03/2022 1017   HDL 43 11/03/2022 1017   CHOLHDL 4.5 11/03/2022 1017   VLDL 34 11/03/2022 1017   LDLCALC 118 (H) 11/03/2022 1017     Risk Assessment/Calculations:                Physical Exam:    VS:  BP 132/74 (BP Location: Left Arm, Patient Position: Sitting, Cuff Size: Large)   Pulse 87   Ht 6' (1.829 m)   Wt 228 lb 12.8 oz (103.8 kg)   SpO2 98%   BMI 31.03 kg/m     Wt Readings from Last 3 Encounters:  01/08/23 228 lb 12.8 oz (103.8 kg)  11/03/22 230 lb 6.4 oz (104.5 kg)  10/20/22 238 lb (108 kg)     GEN:  Well nourished, well developed in no acute distress HEENT: Normal NECK: No JVD; No carotid bruits CARDIAC: RRR, no murmurs, rubs, gallops RESPIRATORY:  Clear to auscultation without rales, wheezing or rhonchi  ABDOMEN: Soft, non-tender, non-distended MUSCULOSKELETAL:  No edema; No deformity  SKIN: Warm and dry NEUROLOGIC:  Alert and oriented x 3 PSYCHIATRIC:  Normal affect   ASSESSMENT:    1. Coronary artery disease involving native coronary artery of native heart, unspecified whether angina present   2. Mixed hyperlipidemia   3. Primary hypertension   4. Smoking    PLAN:    In order of problems listed above:  Mild nonobstructive CAD 25% proximal LAD, minimal RCA stenosis.  Continue aspirin 81 mg daily, Lipitor 80 mg daily.  EF 55 to 60%. Hyperlipidemia, cholesterol not controlled.  Lipitor increased to 80 mg daily.  Low-cholesterol diet advised. Hypertension, controlled.  Continue amlodipine 10, losartan 100 mg daily, HCTZ 25 mg daily.  Continue low salt diet. Smoking, cessation advised.  Follow-up in 6 to 12 months     Medication Adjustments/Labs and Tests Ordered: Current medicines are reviewed at length with the patient today.  Concerns regarding medicines are outlined above.  No orders of the defined types were placed in this encounter.  No orders of the defined types were placed in this encounter.   Patient Instructions  Medication Instructions:   Your physician recommends that you continue on your current medications as directed. Please refer to the Current Medication list given to you today.  *If you need a refill on  your cardiac medications before your next appointment, please call your pharmacy*   Lab Work:  None Ordered  If you have labs (blood work) drawn today and your tests are completely normal, you will receive your results only by: MyChart Message (if you have MyChart) OR A paper copy in the mail If you have any lab test that is abnormal or we need to change your treatment, we will call you to review the results.   Testing/Procedures:  None Ordered  Follow-Up: At [facility], you and your health needs are our priority.  As part of our continuing mission to provide you with exceptional heart care, we have created designated Provider Care Teams.  These Care Teams include your primary Cardiologist (physician) and Advanced Practice Providers (APPs -  Physician Assistants and Nurse Practitioners) who all work together to provide you with the care you need, when you need it.  We recommend signing up for the patient portal called "MyChart".  Sign up information is provided on this After Visit Summary.  MyChart is used to connect with patients for Virtual Visits (Telemedicine).  Patients are able to view lab/test results, encounter notes, upcoming appointments, etc.  Non-urgent messages can be sent to your provider as well.   To learn more about what you can do with MyChart, go to [email or web address].    Your next appointment:   12 month(s)  Provider:   You may see Debbe Odea, MD or one of the following Advanced Practice Providers on your designated Care Team:   Nicolasa Ducking, NP Eula Listen, PA-C Cadence Fransico Michael, PA-C Charlsie Quest, NP   Signed, Debbe Odea, MD  01/08/2023 9:57 AM    [facility]

## 2023-01-08 NOTE — Patient Instructions (Signed)

## 2023-02-26 ENCOUNTER — Ambulatory Visit: Payer: 59 | Admitting: Physician Assistant

## 2023-02-26 ENCOUNTER — Encounter: Payer: Self-pay | Admitting: Physician Assistant

## 2023-02-26 VITALS — BP 123/81 | HR 75 | Ht 72.0 in | Wt 233.4 lb

## 2023-02-26 DIAGNOSIS — F17210 Nicotine dependence, cigarettes, uncomplicated: Secondary | ICD-10-CM

## 2023-02-26 DIAGNOSIS — Z1159 Encounter for screening for other viral diseases: Secondary | ICD-10-CM

## 2023-02-26 DIAGNOSIS — E7849 Other hyperlipidemia: Secondary | ICD-10-CM

## 2023-02-26 DIAGNOSIS — Z9189 Other specified personal risk factors, not elsewhere classified: Secondary | ICD-10-CM | POA: Diagnosis not present

## 2023-02-26 DIAGNOSIS — Z7689 Persons encountering health services in other specified circumstances: Secondary | ICD-10-CM

## 2023-02-26 DIAGNOSIS — M1990 Unspecified osteoarthritis, unspecified site: Secondary | ICD-10-CM

## 2023-02-26 DIAGNOSIS — I10 Essential (primary) hypertension: Secondary | ICD-10-CM

## 2023-02-26 DIAGNOSIS — E66811 Obesity, class 1: Secondary | ICD-10-CM

## 2023-02-26 DIAGNOSIS — Z0100 Encounter for examination of eyes and vision without abnormal findings: Secondary | ICD-10-CM

## 2023-02-26 DIAGNOSIS — G629 Polyneuropathy, unspecified: Secondary | ICD-10-CM

## 2023-02-26 DIAGNOSIS — Z8673 Personal history of transient ischemic attack (TIA), and cerebral infarction without residual deficits: Secondary | ICD-10-CM | POA: Diagnosis not present

## 2023-02-26 DIAGNOSIS — R7303 Prediabetes: Secondary | ICD-10-CM

## 2023-02-26 NOTE — Progress Notes (Signed)
New patient visit  Patient: Alejandro Rush   DOB: Sep 22, 1955   67 y.o. Male  MRN: [id number] Visit Date: 02/26/2023  Today's healthcare provider: Debera Lat, PA-C   Chief Complaint  Patient presents with   New Patient (Initial Visit)    Patient reports hip pain on left side radiating all down his leg.    Subjective    Alejandro Rush is a 67 y.o. male who presents today as a new patient to establish care.     Comments   Patient has declined all vaccines, cologuard and colonoscopy.       Last edited by Acey Lav, CMA on 02/26/2023  8:30 AM.      Discussed the use of AI scribe software for clinical note transcription with the patient, who gave verbal consent to proceed.  History of Present Illness   The patient, with a past medical history of hypertension, hyperlipidemia, stroke, back surgery, and arthritis, presents to establish care. The patient has been a long-term smoker and reports experiencing hip pain, which has been attributed to lower back issues. The patient has been experiencing this pain for some time and has had x-rays and CT scans done to investigate the cause. The patient also reports weight gain over the past three to four months, which he attributes to reduced mobility due to pain. Despite the pain, the patient remains active, working in his shop. The patient is currently on multiple medications including potassium, levothyroxine, hydrochlorothiazide, atorvastatin, doxazosin, losartan, amlodipine, and meloxicam. The patient also reports leg swelling, particularly on the right side, which has been a long-standing issue.        Past Medical History:  Diagnosis Date   Arthritis    History of stroke 2016   Hyperlipidemia    Hypertension    Hypothyroid    Previous back surgery    Tobacco abuse    Past Surgical History:  Procedure Laterality Date   BACK SURGERY     BACK SURGERY     90's   Family Status  Relation Name Status   Mother  Deceased    Father  Deceased   Mat Uncle  Deceased   Mat Uncle  Deceased  No partnership data on file   Family History  Problem Relation Age of Onset   Diabetes Mother    Heart attack Maternal Uncle    Heart attack Maternal Uncle    Social History   Socioeconomic History   Marital status: Single    Spouse name: Not on file   Number of children: Not on file   Years of education: Not on file   Highest education level: Not on file  Occupational History   Not on file  Tobacco Use   Smoking status: Every Day    Current packs/day: 1.00    Average packs/day: 1 pack/day for 30.0 years (30.0 ttl pk-yrs)    Types: Cigarettes   Smokeless tobacco: Not on file  Vaping Use   Vaping status: Never Used  Substance and Sexual Activity   Alcohol use: Yes    Alcohol/week: 4.0 standard drinks of alcohol    Types: 4 Cans of beer per week    Comment: daily consumption   Drug use: Never   Sexual activity: Not on file  Other Topics Concern   Not on file  Social History Narrative   Not on file   Social Determinants of Health   Financial Resource Strain: Not on file  Food Insecurity: Not on file  Transportation Needs:  Not on file  Physical Activity: Not on file  Stress: Not on file  Social Connections: Not on file   Outpatient Medications Prior to Visit  Medication Sig   amLODipine (NORVASC) 10 MG tablet Take 10 mg by mouth daily.   aspirin EC 81 MG EC tablet Take 1 tablet (81 mg total) by mouth daily.   atorvastatin (LIPITOR) 40 MG tablet Take 40 mg by mouth daily.   Cyanocobalamin (B-12 PO) Take by mouth.   doxazosin (CARDURA) 8 MG tablet Take 8 mg by mouth daily.   ferrous sulfate 324 MG TBEC Take 324 mg by mouth.   hydrochlorothiazide (HYDRODIURIL) 25 MG tablet Take 25 mg by mouth daily.   levothyroxine (SYNTHROID) 50 MCG tablet Take 50 mcg by mouth daily.   losartan (COZAAR) 100 MG tablet Take 100 mg by mouth daily.   meloxicam (MOBIC) 15 MG tablet Take 15 mg by mouth daily.   potassium  chloride (KLOR-CON) 10 MEQ tablet Take 10 mEq by mouth daily.   acetaminophen (TYLENOL) 500 MG tablet Take 500 mg by mouth every 6 (six) hours as needed. (Patient not taking: Reported on 02/26/2023)   atorvastatin (LIPITOR) 80 MG tablet Take 1 tablet (80 mg total) by mouth daily at 6 PM.   ibuprofen (ADVIL,MOTRIN) 200 MG tablet Take 200 mg by mouth every 6 (six) hours as needed. (Patient not taking: Reported on 02/26/2023)   Multiple Vitamin (MULTIVITAMIN) tablet Take 1 tablet by mouth daily. (Patient not taking: Reported on 02/26/2023)   No facility-administered medications prior to visit.   No Known Allergies   There is no immunization history on file for this patient.  Health Maintenance  Topic Date Due   Medicare Annual Wellness (AWV)  Never done   Pneumonia Vaccine 50+ Years old (1 of 2 - PCV) Never done   Hepatitis C Screening  Never done   DTaP/Tdap/Td (1 - Tdap) Never done   Colonoscopy  Never done   Zoster Vaccines- Shingrix (1 of 2) Never done   INFLUENZA VACCINE  Never done   COVID-19 Vaccine (1 - 2023-24 season) Never done   Lung Cancer Screening  11/27/2023   HPV VACCINES  Aged Out    Patient Care Team: Debera Lat, PA-C as PCP - General (Physician Assistant) Debbe Odea, MD as PCP - Cardiology (Cardiology)  Review of Systems  All other systems reviewed and are negative.  Except see HPI       Objective    BP 123/81 (BP Location: Left Arm, Patient Position: Sitting, Cuff Size: Normal)   Pulse 75   Ht 6' (1.829 m)   Wt 233 lb 6.4 oz (105.9 kg)   SpO2 97%   BMI 31.65 kg/m     Physical Exam Vitals reviewed.  Constitutional:      General: He is not in acute distress.    Appearance: Normal appearance. He is not diaphoretic.  HENT:     Head: Normocephalic and atraumatic.  Eyes:     General: No scleral icterus.    Conjunctiva/sclera: Conjunctivae normal.  Cardiovascular:     Rate and Rhythm: Normal rate and regular rhythm.     Pulses:  Normal pulses.     Heart sounds: Normal heart sounds. No murmur heard. Pulmonary:     Effort: Pulmonary effort is normal. No respiratory distress.     Breath sounds: Normal breath sounds. No wheezing or rhonchi.  Musculoskeletal:     Cervical back: Neck supple.     Right lower leg:  Edema present.     Left lower leg: No edema.  Lymphadenopathy:     Cervical: No cervical adenopathy.  Skin:    General: Skin is warm and dry.     Findings: No rash.  Neurological:     Mental Status: He is alert and oriented to person, place, and time. Mental status is at baseline.  Psychiatric:        Mood and Affect: Mood normal.        Behavior: Behavior normal.    Depression Screen Pt declined all screening  No results found for any visits on 02/26/23.  Assessment & Plan     Primary hypertension Other hyperlipidemia  Hypertension and Hyperlipidemia Patient is on hydrochlorothiazide , atorvastatin , and losartan . Last lipid panel showed high cholesterol despite statin therapy. -Continue current medications. -Order blood work to reassess lipid panel and kidney function.  Smoking greater than 40 pack years Patient reports smoking approximately 10 cigarettes per day for over 14 years. -Discuss smoking cessation strategies at next visit. -Consider CT for lung cancer screening due to long history of smoking.  Arthritis Osteoarthritis Patient reports widespread pain, particularly in the knees and shoulder. Currently on meloxicam for pain, but reports it is not effective. -Continue current orthopedic follow-up. -Consider referral to rheumatology if pain persists. -Encourage physical activity and stretching exercises.  Need for hepatitis C screening test Low risk screening - Hepatitis C antibody  Obesity (BMI 30.0-34.9) Weight Gain Patient reports gaining approximately 6-7 pounds over the past 3-4 months. -Encourage healthy diet and increased physical activity. -Monitor  weight at subsequent visits. Initial workup - Hemoglobin A1c - TSH - CBC with Differential/Platelet - Comprehensive metabolic panel - Urine Microalbumin w/creat. ratio Will reassess after  receiving lab results  Prediabetes In the past Ordered - Hemoglobin A1c Advised low carb diet and regular exercise  Encounter for complete eye exam He does not remember when she  - Ambulatory referral to Ophthalmology  Neuropathy History of stroke Patient reports persistent strange sensations and tingling on the right side of the body since the stroke. -Refer to neurology for further assessment and management. - Ambulatory referral to Neurology   Alcohol Consumption Patient reports drinking approximately 4 beers per day, not daily. -Advise reduction in alcohol consumption to prevent potential liver damage.  The following problems will need to be addressed at the FOLLOW-UP  Colon Cancer Screening Patient had a positive Cologuard test but has not followed up with recommended colonoscopy. -Discuss the importance of colonoscopy for colon cancer screening at next visit. Pt declined   Leg Swelling Noted more significant swelling in the right leg compared to the left. Could be due to weight vs arthritis. Will reassess at next visit  Follow-up -Schedule follow-up visit in 6 weeks to reassess chronic conditions and discuss lab results. -Schedule annual wellness visit. -Refer to ophthalmology for eye examination.     Encounter to establish care Welcomed to our clinic Reviewed past medical hx, social hx, family hx and surgical hx Pt advised to send all vaccination records or screening   Return in about 6 weeks (around 04/09/2023) for annual wellness with brenda, chronic disease f/u.    The patient was advised to call back or seek an in-person evaluation if the symptoms worsen or if the condition fails to improve as anticipated.  I discussed the assessment and treatment plan with the  patient. The patient was provided an opportunity to ask questions and all were answered. The patient agreed with the  plan and demonstrated an understanding of the instructions.  I, Debera Lat, PA-C have reviewed all documentation for this visit. The documentation on 02/26/23 for the exam, diagnosis, procedures, and orders are all accurate and complete.  Debera Lat, [facility], MMS [facility] [phone] (phone) [phone] (fax)  [facility]

## 2023-02-27 DIAGNOSIS — E7849 Other hyperlipidemia: Secondary | ICD-10-CM | POA: Insufficient documentation

## 2023-02-27 DIAGNOSIS — M1990 Unspecified osteoarthritis, unspecified site: Secondary | ICD-10-CM | POA: Insufficient documentation

## 2023-02-27 DIAGNOSIS — Z8673 Personal history of transient ischemic attack (TIA), and cerebral infarction without residual deficits: Secondary | ICD-10-CM | POA: Insufficient documentation

## 2023-02-27 DIAGNOSIS — I10 Essential (primary) hypertension: Secondary | ICD-10-CM | POA: Insufficient documentation

## 2023-02-27 DIAGNOSIS — F17210 Nicotine dependence, cigarettes, uncomplicated: Secondary | ICD-10-CM | POA: Insufficient documentation

## 2023-02-27 DIAGNOSIS — R7303 Prediabetes: Secondary | ICD-10-CM | POA: Insufficient documentation

## 2023-02-27 DIAGNOSIS — G629 Polyneuropathy, unspecified: Secondary | ICD-10-CM | POA: Insufficient documentation

## 2023-02-27 DIAGNOSIS — Z9189 Other specified personal risk factors, not elsewhere classified: Secondary | ICD-10-CM | POA: Insufficient documentation

## 2023-02-27 DIAGNOSIS — E66811 Obesity, class 1: Secondary | ICD-10-CM | POA: Insufficient documentation

## 2023-02-27 LAB — HEMOGLOBIN A1C
Est. average glucose Bld gHb Est-mCnc: 123 mg/dL
Hgb A1c MFr Bld: 5.9 % — ABNORMAL HIGH (ref 4.8–5.6)

## 2023-02-27 LAB — MICROALBUMIN / CREATININE URINE RATIO
Creatinine, Urine: 40.7 mg/dL
Microalb/Creat Ratio: <7 mg/g{creat} (ref 0–29)
Microalbumin, Urine: <3 ug/mL

## 2023-02-27 LAB — COMPREHENSIVE METABOLIC PANEL
ALT: 22 [IU]/L (ref 0–44)
AST: 26 [IU]/L (ref 0–40)
Albumin: 4.8 g/dL (ref 3.9–4.9)
Alkaline Phosphatase: 74 [IU]/L (ref 44–121)
BUN/Creatinine Ratio: 15 (ref 10–24)
BUN: 21 mg/dL (ref 8–27)
Bilirubin Total: 0.4 mg/dL (ref 0.0–1.2)
CO2: 21 mmol/L (ref 20–29)
Calcium: 9.9 mg/dL (ref 8.6–10.2)
Chloride: 102 mmol/L (ref 96–106)
Creatinine, Ser: 1.36 mg/dL — ABNORMAL HIGH (ref 0.76–1.27)
Globulin, Total: 2.1 g/dL (ref 1.5–4.5)
Glucose: 104 mg/dL — ABNORMAL HIGH (ref 70–99)
Potassium: 4 mmol/L (ref 3.5–5.2)
Sodium: 138 mmol/L (ref 134–144)
Total Protein: 6.9 g/dL (ref 6.0–8.5)
eGFR: 56 mL/min/{1.73_m2} — ABNORMAL LOW (ref >59)

## 2023-02-27 LAB — TSH: TSH: 1.66 u[IU]/mL (ref 0.450–4.500)

## 2023-02-27 LAB — CBC WITH DIFFERENTIAL/PLATELET
Basophils Absolute: 0.1 10*3/uL (ref 0.0–0.2)
Basos: 1 %
EOS (ABSOLUTE): 0.2 10*3/uL (ref 0.0–0.4)
Eos: 3 %
Hematocrit: 37.9 % (ref 37.5–51.0)
Hemoglobin: 12.5 g/dL — ABNORMAL LOW (ref 13.0–17.7)
Immature Grans (Abs): 0 10*3/uL (ref 0.0–0.1)
Immature Granulocytes: 0 %
Lymphocytes Absolute: 2.2 10*3/uL (ref 0.7–3.1)
Lymphs: 25 %
MCH: 29.6 pg (ref 26.6–33.0)
MCHC: 33 g/dL (ref 31.5–35.7)
MCV: 90 fL (ref 79–97)
Monocytes Absolute: 0.8 10*3/uL (ref 0.1–0.9)
Monocytes: 9 %
Neutrophils Absolute: 5.5 10*3/uL (ref 1.4–7.0)
Neutrophils: 62 %
Platelets: 377 10*3/uL (ref 150–450)
RBC: 4.22 x10E6/uL (ref 4.14–5.80)
RDW: 12.7 % (ref 11.6–15.4)
WBC: 8.8 10*3/uL (ref 3.4–10.8)

## 2023-02-27 LAB — HEPATITIS C ANTIBODY: Hep C Virus Ab: NONREACTIVE

## 2023-03-02 ENCOUNTER — Ambulatory Visit: Payer: 59

## 2023-03-02 VITALS — BP 110/68 | Ht 72.0 in | Wt 234.2 lb

## 2023-03-02 DIAGNOSIS — Z0000 Encounter for general adult medical examination without abnormal findings: Secondary | ICD-10-CM

## 2023-03-02 DIAGNOSIS — Z0100 Encounter for examination of eyes and vision without abnormal findings: Secondary | ICD-10-CM

## 2023-03-02 NOTE — Patient Instructions (Signed)
Alejandro Rush , Thank you for taking time to come for your Medicare Wellness Visit. I appreciate your ongoing commitment to your health goals. Please review the following plan we discussed and let me know if I can assist you in the future.   Referrals/Orders/Follow-Ups/Clinician Recommendations:   You have been referred to establish care with a new eye care provider. If you have not heard from them in the next week, please call to schedule your appointment  [facility] [address] [place] [address] Ph [phone]   This is a list of the screening recommended for you and due dates:  Health Maintenance  Topic Date Due   COVID-19 Vaccine (1 - 2023-24 season) 03/18/2023*   Zoster (Shingles) Vaccine (1 of 2) 06/02/2023*   Flu Shot  08/17/2023*   DTaP/Tdap/Td vaccine (1 - Tdap) 03/01/2024*   Pneumonia Vaccine (1 of 2 - PCV) 03/01/2024*   Colon Cancer Screening  03/01/2024*   Screening for Lung Cancer  11/27/2023   Medicare Annual Wellness Visit  03/01/2024   Hepatitis C Screening  Completed   HPV Vaccine  Aged Out  *Topic was postponed. The date shown is not the original due date.    Advanced directives: (Declined) Advance directive discussed with you today. Even though you declined this today, please call our office should you change your mind, and we can give you the proper paperwork for you to fill out.  Next Medicare Annual Wellness Visit scheduled for next year: Yes 03/08/2024 @ 8:15am in person

## 2023-03-02 NOTE — Progress Notes (Signed)
Subjective:   Alejandro Rush is a 67 y.o. male who presents for an Initial Medicare Annual Wellness Visit.  Visit Complete: In person  Patient Medicare AWV questionnaire was completed by the patient on (not done); I have confirmed that all information answered by patient is correct and no changes since this date. Cardiac Risk Factors include: advanced age (>18men, >17 women);dyslipidemia;hypertension;male gender;obesity (BMI >30kg/m2);sedentary lifestyle    Objective:    Today's Vitals   03/02/23 1031 03/02/23 1032  BP: 110/68   Weight: 234 lb 3.2 oz (106.2 kg)   Height: 6' (1.829 m)   PainSc:  5    Body mass index is 31.76 kg/m.     03/02/2023   10:47 AM 12/26/2014    6:31 PM 12/26/2014    3:09 PM  Advanced Directives  Does Patient Have a Medical Advance Directive? No No No  Would patient like information on creating a medical advance directive?  No - patient declined information Yes - Educational materials given    Current Medications (verified) Outpatient Encounter Medications as of 03/02/2023  Medication Sig   amLODipine (NORVASC) 10 MG tablet Take 10 mg by mouth daily.   aspirin EC 81 MG EC tablet Take 1 tablet (81 mg total) by mouth daily.   atorvastatin (LIPITOR) 40 MG tablet Take 40 mg by mouth daily.   Cyanocobalamin (B-12 PO) Take by mouth.   doxazosin (CARDURA) 8 MG tablet Take 8 mg by mouth daily.   ferrous sulfate 324 MG TBEC Take 324 mg by mouth.   hydrochlorothiazide (HYDRODIURIL) 25 MG tablet Take 25 mg by mouth daily.   levothyroxine (SYNTHROID) 50 MCG tablet Take 50 mcg by mouth daily.   losartan (COZAAR) 100 MG tablet Take 100 mg by mouth daily.   meloxicam (MOBIC) 15 MG tablet Take 15 mg by mouth daily.   Multiple Vitamin (MULTIVITAMIN) tablet Take 1 tablet by mouth daily.   potassium chloride (KLOR-CON) 10 MEQ tablet Take 10 mEq by mouth daily.   atorvastatin (LIPITOR) 80 MG tablet Take 1 tablet (80 mg total) by mouth daily at 6 PM.   No  facility-administered encounter medications on file as of 03/02/2023.    Allergies (verified) Patient has no known allergies.   History: Past Medical History:  Diagnosis Date   Arthritis    History of stroke 2016   Hyperlipidemia    Hypertension    Hypothyroid    Previous back surgery    Tobacco abuse    Past Surgical History:  Procedure Laterality Date   BACK SURGERY     BACK SURGERY     90's   Family History  Problem Relation Age of Onset   Diabetes Mother    Heart attack Maternal Uncle    Heart attack Maternal Uncle    Social History   Socioeconomic History   Marital status: Single    Spouse name: Not on file   Number of children: Not on file   Years of education: Not on file   Highest education level: Not on file  Occupational History   Not on file  Tobacco Use   Smoking status: Every Day    Current packs/day: 1.00    Average packs/day: 1 pack/day for 30.0 years (30.0 ttl pk-yrs)    Types: Cigarettes   Smokeless tobacco: Not on file  Vaping Use   Vaping status: Never Used  Substance and Sexual Activity   Alcohol use: Yes    Alcohol/week: 4.0 standard drinks of alcohol  Types: 4 Cans of beer per week    Comment: daily consumption   Drug use: Never   Sexual activity: Not on file  Other Topics Concern   Not on file  Social History Narrative   Not on file   Social Determinants of Health   Financial Resource Strain: Low Risk  (03/02/2023)   Overall Financial Resource Strain (CARDIA)    Difficulty of Paying Living Expenses: Not hard at all  Food Insecurity: No Food Insecurity (03/02/2023)   Hunger Vital Sign    Worried About Running Out of Food in the Last Year: Never true    Ran Out of Food in the Last Year: Never true  Transportation Needs: No Transportation Needs (03/02/2023)   PRAPARE - Administrator, Civil Service (Medical): No    Lack of Transportation (Non-Medical): No  Physical Activity: Inactive (03/02/2023)   Exercise  Vital Sign    Days of Exercise per Week: 0 days    Minutes of Exercise per Session: 0 min  Stress: No Stress Concern Present (03/02/2023)   [employer] of Occupational Health - Occupational Stress Questionnaire    Feeling of Stress : Only a little  Social Connections: Moderately Isolated (03/02/2023)   Social Connection and Isolation Panel [NHANES]    Frequency of Communication with Friends and Family: More than three times a week    Frequency of Social Gatherings with Friends and Family: Twice a week    Attends Religious Services: Never    Database administrator or Organizations: No    Attends Engineer, structural: Never    Marital Status: Living with partner    Tobacco Counseling Ready to quit: Not Answered Counseling given: Not Answered  Clinical Intake:  Pre-visit preparation completed: No  Pain : 0-10 Pain Score: 5  Pain Type: Chronic pain Pain Location: Shoulder (both legs hurt;rt hip pain radiating down leg all several months) Pain Orientation: Right Pain Descriptors / Indicators: Aching Pain Onset: More than a month ago Pain Frequency: Constant Pain Relieving Factors: has has an injection but doe snot work Effect of Pain on Daily Activities: limits ADLs  Pain Relieving Factors: has has an injection but doe snot work  BMI - recorded: 31.76 Nutritional Status: BMI > 30  Obese Nutritional Risks: None Diabetes: No  How often do you need to have someone help you when you read instructions, pamphlets, or other written materials from your doctor or pharmacy?: 1 - Never  Interpreter Needed?: No  Comments: lives with partner Information entered by :: B.Cinde Ebert,LPN   Activities of Daily Living    03/02/2023   10:47 AM  In your present state of health, do you have any difficulty performing the following activities:  Hearing? 0  Vision? 1  Difficulty concentrating or making decisions? 0  Walking or climbing stairs? 1  Dressing or bathing? 0   Doing errands, shopping? 0  Preparing Food and eating ? N  Using the Toilet? N  In the past six months, have you accidently leaked urine? N  Do you have problems with loss of bowel control? N  Managing your Medications? N  Managing your Finances? N  Housekeeping or managing your Housekeeping? N    Patient Care Team: Debera Lat, PA-C as PCP - General (Physician Assistant) Debbe Odea, MD as PCP - Cardiology (Cardiology)  Indicate any recent Medical Services you may have received from other than [facility] providers in the past year (date may be approximate).  Assessment:   This is a routine wellness examination for Nthony.  Hearing/Vision screen Hearing Screening - Comments:: Pt says I can hear pretty good Vision Screening - Comments:: Pt says his vision is blurry: Pt says he has never been to eye dr-is waiting appt Referral to [facility]   Goals Addressed             This Visit's Progress    Patient Stated       Pt says he would like be more mobile and less pain       Depression Screen    03/02/2023   10:43 AM  PHQ 2/9 Scores  PHQ - 2 Score 0    Fall Risk    03/02/2023   10:38 AM  Fall Risk   Falls in the past year? 0  Number falls in past yr: 0  Injury with Fall? 0  Risk for fall due to : No Fall Risks  Follow up Education provided;Falls prevention discussed    MEDICARE RISK AT HOME: Medicare Risk at Home Any stairs in or around the home?: Yes If so, are there any without handrails?: Yes Home free of loose throw rugs in walkways, pet beds, electrical cords, etc?: Yes Adequate lighting in your home to reduce risk of falls?: Yes Life alert?: No Use of a cane, walker or w/c?: No Grab bars in the bathroom?: No Shower chair or bench in shower?: No Elevated toilet seat or a handicapped toilet?: No  TIMED UP AND GO:  Was the test performed? Yes  Length of time to ambulate 10 feet: 14 sec Gait slow and steady without use of assistive device     Cognitive Function:        03/02/2023   10:52 AM  6CIT Screen  What Year? 0 points  What month? 0 points  What time? 0 points  Count back from 20 0 points  Months in reverse 4 points  Repeat phrase 2 points  Total Score 6 points   Immunizations  There is no immunization history on file for this patient.  TDAP status: Due, Education has been provided regarding the importance of this vaccine. Advised may receive this vaccine at local pharmacy or Health Dept. Aware to provide a copy of the vaccination record if obtained from local pharmacy or Health Dept. Verbalized acceptance and understanding.  Flu Vaccine status: Declined, Education has been provided regarding the importance of this vaccine but patient still declined. Advised may receive this vaccine at local pharmacy or Health Dept. Aware to provide a copy of the vaccination record if obtained from local pharmacy or Health Dept. Verbalized acceptance and understanding.  Pneumococcal vaccine status: Declined,  Education has been provided regarding the importance of this vaccine but patient still declined. Advised may receive this vaccine at local pharmacy or Health Dept. Aware to provide a copy of the vaccination record if obtained from local pharmacy or Health Dept. Verbalized acceptance and understanding.   Covid-19 vaccine status: Declined, Education has been provided regarding the importance of this vaccine but patient still declined. Advised may receive this vaccine at local pharmacy or Health Dept.or vaccine clinic. Aware to provide a copy of the vaccination record if obtained from local pharmacy or Health Dept. Verbalized acceptance and understanding.  Qualifies for Shingles Vaccine? Yes   Zostavax completed No   Shingrix Completed?: No.    Education has been provided regarding the importance of this vaccine. Patient has been advised to call insurance company to determine out  of pocket expense if they have not yet received  this vaccine. Advised may also receive vaccine at local pharmacy or Health Dept. Verbalized acceptance and understanding.  Screening Tests Health Maintenance  Topic Date Due   COVID-19 Vaccine (1 - 2023-24 season) 03/18/2023 (Originally 01/18/2023)   Zoster Vaccines- Shingrix (1 of 2) 06/02/2023 (Originally 11/06/2003)   INFLUENZA VACCINE  08/17/2023 (Originally 12/18/2022)   DTaP/Tdap/Td (1 - Tdap) 03/01/2024 (Originally 11/05/1972)   Pneumonia Vaccine 34+ Years old (1 of 2 - PCV) 03/01/2024 (Originally 11/06/1959)   Colonoscopy  03/01/2024 (Originally 11/06/1998)   Lung Cancer Screening  11/27/2023   Medicare Annual Wellness (AWV)  03/01/2024   Hepatitis C Screening  Completed   HPV VACCINES  Aged Out    Health Maintenance  There are no preventive care reminders to display for this patient.   Colorectal cancer screening: Type of screening: Colonoscopy. Completed NO. Repeat every 5-10 years will discuss with PCP (does not want)  Lung Cancer Screening: (Low Dose CT Chest recommended if Age 70-80 years, 20 pack-year currently smoking OR have quit w/in 15years.) does qualify.   Lung Cancer Screening Referral: no pt declines  Additional Screening:  Hepatitis C Screening: does not qualify; Completed 02/27/23  Vision Screening: Recommended annual ophthalmology exams for early detection of glaucoma and other disorders of the eye. Is the patient up to date with their annual eye exam?  No  Who is the provider or what is the name of the office in which the patient attends annual eye exams? none If pt is not established with a provider, would they like to be referred to a provider to establish care? Yes .   Dental Screening: Recommended annual dental exams for proper oral hygiene  Diabetic Foot Exam: n/a  Community Resource Referral / Chronic Care Management: CRR required this visit?  No   CCM required this visit?  No   Plan:     I have personally reviewed and noted the following in  the patient's chart:   Medical and social history Use of alcohol, tobacco or illicit drugs  Current medications and supplements including opioid prescriptions. Patient is not currently taking opioid prescriptions. Functional ability and status Nutritional status Physical activity Advanced directives List of other physicians Hospitalizations, surgeries, and ER visits in previous 12 months Vitals Screenings to include cognitive, depression, and falls Referrals and appointments  In addition, I have reviewed and discussed with patient certain preventive protocols, quality metrics, and best practice recommendations. A written personalized care plan for preventive services as well as general preventive health recommendations were provided to patient.    Sue Lush, LPN   16/02/9603   After Visit Summary: (In Person-Printed) AVS printed and given to the patient  Nurse Notes: Pt says he has waited for eye provider referral as he having blurry vision referral made). Pt declines all vaccines and says he does not want to colonoscopy or lung cancer screening but will talk with PCP. No concerns or questions at this time.  **Referral made to [facility]

## 2023-03-16 DIAGNOSIS — H02831 Dermatochalasis of right upper eyelid: Secondary | ICD-10-CM | POA: Diagnosis not present

## 2023-03-16 DIAGNOSIS — H35363 Drusen (degenerative) of macula, bilateral: Secondary | ICD-10-CM | POA: Diagnosis not present

## 2023-03-16 DIAGNOSIS — H2513 Age-related nuclear cataract, bilateral: Secondary | ICD-10-CM | POA: Diagnosis not present

## 2023-03-16 DIAGNOSIS — H43813 Vitreous degeneration, bilateral: Secondary | ICD-10-CM | POA: Diagnosis not present

## 2023-03-27 ENCOUNTER — Other Ambulatory Visit: Payer: Self-pay | Admitting: Physician Assistant

## 2023-03-27 NOTE — Telephone Encounter (Signed)
Medication Refill -  Most Recent Primary Care Visit:  Provider: Sue Lush  Department: [facility]  Visit Type: MEDICARE AWV, INITIAL  Date: 03/02/2023  Medication: atorvastatin (LIPITOR) 40 MG tablet [[id number]]   Has the patient contacted their pharmacy? Yes (Agent: If no, request that the patient contact the pharmacy for the refill. If patient does not wish to contact the pharmacy document the reason why and proceed with request.) (Agent: If yes, when and what did the pharmacy advise?)  Is this the correct pharmacy for this prescription? Yes If no, delete pharmacy and type the correct one.  This is the patient's preferred pharmacy:  [facility] [address] [place] - [address] [place] [address] Phone: [phone] Fax: [phone]   Has the prescription been filled recently? Yes  Is the patient out of the medication? No  Has the patient been seen for an appointment in the last year OR does the patient have an upcoming appointment? Yes  Can we respond through MyChart? No  Agent: Please be advised that Rx refills may take up to 3 business days. We ask that you follow-up with your pharmacy.

## 2023-03-27 NOTE — Telephone Encounter (Signed)
Requested medications are due for refill today.  yes  Requested medications are on the active medications list.  yes  Last refill. 01/23/2023  Future visit scheduled.   yes  Notes to clinic.  Medication is historical.    Requested Prescriptions  Pending Prescriptions Disp Refills   atorvastatin (LIPITOR) 40 MG tablet      Sig: Take 1 tablet (40 mg total) by mouth daily.     Cardiovascular:  Antilipid - Statins Failed - 03/27/2023  8:48 AM      Failed - Lipid Panel in normal range within the last 12 months    Cholesterol  Date Value Ref Range Status  11/03/2022 195 0 - 200 mg/dL Final   LDL Cholesterol  Date Value Ref Range Status  11/03/2022 118 (H) 0 - 99 mg/dL Final    Comment:           Total Cholesterol/HDL:CHD Risk Coronary Heart Disease Risk Table                     Men   Women  1/2 Average Risk   3.4   3.3  Average Risk       5.0   4.4  2 X Average Risk   9.6   7.1  3 X Average Risk  23.4   11.0        Use the calculated Patient Ratio above and the CHD Risk Table to determine the patient's CHD Risk.        ATP III CLASSIFICATION (LDL):  <100     mg/dL   Optimal  623-762  mg/dL   Near or Above                    Optimal  130-159  mg/dL   Borderline  831-517  mg/dL   High  >616     mg/dL   Very High Performed at [facility], [address] [place], [place] [address]    HDL  Date Value Ref Range Status  11/03/2022 43 >40 mg/dL Final   Triglycerides  Date Value Ref Range Status  11/03/2022 168 (H) <150 mg/dL Final         Passed - Patient is not pregnant      Passed - Valid encounter within last 12 months    Recent Outpatient Visits           4 weeks ago Primary hypertension   [facility] [place], [place], PA-C       Future Appointments             In 1 week Debera Lat, PA-C [facility], [facility]

## 2023-03-31 ENCOUNTER — Telehealth: Payer: Self-pay | Admitting: Cardiology

## 2023-03-31 ENCOUNTER — Other Ambulatory Visit: Payer: Self-pay

## 2023-03-31 MED ORDER — ATORVASTATIN CALCIUM 80 MG PO TABS: 80.0000 mg | ORAL_TABLET | Freq: Every day | ORAL | 0 refills | Status: DC

## 2023-03-31 NOTE — Telephone Encounter (Signed)
Disp Refills Start End   atorvastatin (LIPITOR) 80 MG tablet 90 tablet 0 03/31/2023 06/29/2023   Sig - Route: Take 1 tablet (80 mg total) by mouth daily at 6 PM. - Oral   Sent to pharmacy as: atorvastatin (LIPITOR) 80 MG tablet   E-Prescribing Status: Receipt confirmed by pharmacy (03/31/2023  9:05 AM EST)    Pharmacy  [id number] [place], [place] - [address]

## 2023-03-31 NOTE — Telephone Encounter (Signed)
Please advise if ok to refill historical medication. ? ?

## 2023-03-31 NOTE — Telephone Encounter (Signed)
*  STAT* If patient is at the pharmacy, call can be transferred to refill team.   1. Which medications need to be refilled? (please list name of each medication and dose if known) atorvastatin (LIPITOR) 80 MG tablet (Expired)   2. Which pharmacy/location (including street and city if local pharmacy) is medication to be sent to? [id number] [place], [place] - [address]    3. Do they need a 30 day or 90 day supply? 90 day

## 2023-04-08 NOTE — Progress Notes (Signed)
Established patient visit  Patient: Alejandro Rush   DOB: Jun 13, 1955   67 y.o. Male  MRN: [id number] Visit Date: 04/09/2023  Today's healthcare provider: Debera Lat, PA-C   Chief Complaint  Patient presents with   Medical Management of Chronic Issues       Subjective     HPI     Medical Management of Chronic Issues    Additional comments: 6 week follow-up. Patient reports he is taking medications as prescribed. Reports lower leg edema along with leg pain, numbness/tingling in fingers depending on how he is laying (associates with his arthritis), also reports some shoulder pain. Pt reports checking blood pressure at home and reports it was in range.       Last edited by Acey Lav, CMA on 04/09/2023  8:11 AM.           03/02/2023   10:43 AM  Depression screen PHQ 2/9  Decreased Interest 0  Down, Depressed, Hopeless 0  PHQ - 2 Score 0       No data to display          Medications: Outpatient Medications Prior to Visit  Medication Sig   amLODipine (NORVASC) 10 MG tablet Take 10 mg by mouth daily.   aspirin EC 81 MG EC tablet Take 1 tablet (81 mg total) by mouth daily.   atorvastatin (LIPITOR) 80 MG tablet Take 1 tablet (80 mg total) by mouth daily at 6 PM.   Cyanocobalamin (B-12 PO) Take by mouth.   doxazosin (CARDURA) 8 MG tablet Take 8 mg by mouth daily.   ferrous sulfate 324 MG TBEC Take 324 mg by mouth.   hydrochlorothiazide (HYDRODIURIL) 25 MG tablet Take 25 mg by mouth daily.   levothyroxine (SYNTHROID) 50 MCG tablet Take 50 mcg by mouth daily.   losartan (COZAAR) 100 MG tablet Take 100 mg by mouth daily.   meloxicam (MOBIC) 15 MG tablet Take 15 mg by mouth daily.   Multiple Vitamin (MULTIVITAMIN) tablet Take 1 tablet by mouth daily.   potassium chloride (KLOR-CON) 10 MEQ tablet Take 10 mEq by mouth daily.   No facility-administered medications prior to visit.    Review of Systems  All other systems reviewed and are negative.  Except see HPI        Objective    BP 132/80 (BP Location: Left Arm, Patient Position: Sitting, Cuff Size: Large)   Pulse 81   Ht 6' (1.829 m)   Wt 235 lb 4.8 oz (106.7 kg)   SpO2 100%   BMI 31.91 kg/m     Physical Exam Vitals reviewed.  Constitutional:      General: He is not in acute distress.    Appearance: Normal appearance. He is obese. He is not diaphoretic.  HENT:     Head: Normocephalic and atraumatic.  Eyes:     General: No scleral icterus.    Conjunctiva/sclera: Conjunctivae normal.  Cardiovascular:     Rate and Rhythm: Normal rate and regular rhythm.     Pulses: Normal pulses.     Heart sounds: Normal heart sounds. No murmur heard. Pulmonary:     Effort: Pulmonary effort is normal. No respiratory distress.     Breath sounds: Normal breath sounds. No wheezing or rhonchi.  Musculoskeletal:     Cervical back: Neck supple.     Right lower leg: No edema.     Left lower leg: No edema.  Lymphadenopathy:     Cervical: No cervical adenopathy.  Skin:  General: Skin is warm and dry.     Findings: No rash.  Neurological:     Mental Status: He is alert and oriented to person, place, and time. Mental status is at baseline.  Psychiatric:        Mood and Affect: Mood normal.        Behavior: Behavior normal.      No results found for any visits on 04/09/23.  Assessment & Plan      Colon cancer screening Will revisit.  At risk alcohol consumption Continues to drink up to 7 beers per week? Reduction in alcohol consumption advised   Prediabetes Chronic  Last A1c was 5.9  Advised lifestyle modifications Will FOLLOW-UP   Obesity (BMI 30.0-34.9) Chronic, associated with HTN, HLD, Prediabetes BMI 31+ Encourage healthy diet and increased physical activity Lab results showed Will FOLLOW-UP  Smoking  Smoking cessation was addressed  Patient was advised to quit smoking  Pt is making attempts and smoking 1/2 pack a day? Will reassess at the next appt   Primary  hypertension Chronic with decreased kidney function and BP today was 132/80 Hx of stroke Continue hydrochlorothiazide 25 mg, losartan 100mg  Last labs showed GFR of 56  with urine microalbumin w/creat ratio WNL Pt was advised to have an adequate hydration , avoid NSAIDs Continue low salt diet and daily exercise   Other hyperlipidemia Chronic  Last LP showed LDL of 118 Continue lipitor 40mg  The 10-year ASCVD risk score (Arnett DK, et al., 2019) is: 26.6% based on past LP Repeat LP at the next follow-up Will advised to increase lipitor if pt agrees Will FU   Arthralgia, unspecified joint Most likely due to arthritis  Per pt request, - Ambulatory referral to Orthopedic Surgery - ANA Direct w/Reflex if Positive - Rheumatoid Factor - Sed Rate (ESR) Will Follow-up   Return in about 3 months (around 07/10/2023) for chronic disease f/u.     The patient was advised to call back or seek an in-person evaluation if the symptoms worsen or if the condition fails to improve as anticipated.  I discussed the assessment and treatment plan with the patient. The patient was provided an opportunity to ask questions and all were answered. The patient agreed with the plan and demonstrated an understanding of the instructions.  I11/21/24  for the exam, diagnosis, procedures, and orders are all accurate and complete.  Debera Lat, [facility], MMS [facility] [phone] (phone) [phone] (fax)  [facility]

## 2023-04-09 ENCOUNTER — Ambulatory Visit (INDEPENDENT_AMBULATORY_CARE_PROVIDER_SITE_OTHER): Payer: 59 | Admitting: Physician Assistant

## 2023-04-09 ENCOUNTER — Encounter: Payer: Self-pay | Admitting: Physician Assistant

## 2023-04-09 VITALS — BP 132/80 | HR 81 | Ht 72.0 in | Wt 235.3 lb

## 2023-04-09 DIAGNOSIS — Z9189 Other specified personal risk factors, not elsewhere classified: Secondary | ICD-10-CM

## 2023-04-09 DIAGNOSIS — I10 Essential (primary) hypertension: Secondary | ICD-10-CM

## 2023-04-09 DIAGNOSIS — F17210 Nicotine dependence, cigarettes, uncomplicated: Secondary | ICD-10-CM

## 2023-04-09 DIAGNOSIS — R7303 Prediabetes: Secondary | ICD-10-CM

## 2023-04-09 DIAGNOSIS — M7989 Other specified soft tissue disorders: Secondary | ICD-10-CM

## 2023-04-09 DIAGNOSIS — E7849 Other hyperlipidemia: Secondary | ICD-10-CM

## 2023-04-09 DIAGNOSIS — M1990 Unspecified osteoarthritis, unspecified site: Secondary | ICD-10-CM | POA: Diagnosis not present

## 2023-04-09 DIAGNOSIS — M2550 Pain in unspecified joint: Secondary | ICD-10-CM | POA: Diagnosis not present

## 2023-04-09 DIAGNOSIS — Z8673 Personal history of transient ischemic attack (TIA), and cerebral infarction without residual deficits: Secondary | ICD-10-CM

## 2023-04-09 DIAGNOSIS — E66811 Obesity, class 1: Secondary | ICD-10-CM | POA: Diagnosis not present

## 2023-04-09 DIAGNOSIS — G629 Polyneuropathy, unspecified: Secondary | ICD-10-CM

## 2023-04-09 DIAGNOSIS — Z1211 Encounter for screening for malignant neoplasm of colon: Secondary | ICD-10-CM

## 2023-04-24 DIAGNOSIS — M1712 Unilateral primary osteoarthritis, left knee: Secondary | ICD-10-CM | POA: Diagnosis not present

## 2023-04-24 DIAGNOSIS — M7541 Impingement syndrome of right shoulder: Secondary | ICD-10-CM | POA: Diagnosis not present

## 2023-05-05 ENCOUNTER — Other Ambulatory Visit: Payer: Self-pay

## 2023-05-05 MED ORDER — ATORVASTATIN CALCIUM 80 MG PO TABS: 80.0000 mg | ORAL_TABLET | Freq: Every day | ORAL | 1 refills | Status: DC

## 2023-05-05 MED ORDER — POTASSIUM CHLORIDE ER 10 MEQ PO TBCR: 10.0000 meq | EXTENDED_RELEASE_TABLET | Freq: Every day | ORAL | 1 refills | Status: DC

## 2023-06-15 ENCOUNTER — Other Ambulatory Visit: Payer: Self-pay | Admitting: Physician Assistant

## 2023-06-16 ENCOUNTER — Other Ambulatory Visit: Payer: Self-pay | Admitting: Physician Assistant

## 2023-06-16 DIAGNOSIS — I10 Essential (primary) hypertension: Secondary | ICD-10-CM | POA: Diagnosis not present

## 2023-06-16 DIAGNOSIS — R200 Anesthesia of skin: Secondary | ICD-10-CM | POA: Diagnosis not present

## 2023-06-16 DIAGNOSIS — R7309 Other abnormal glucose: Secondary | ICD-10-CM | POA: Diagnosis not present

## 2023-06-16 DIAGNOSIS — E7849 Other hyperlipidemia: Secondary | ICD-10-CM | POA: Diagnosis not present

## 2023-06-16 NOTE — Telephone Encounter (Unsigned)
Copied from CRM [id number]. Topic: General - Other >> Jun 16, 2023  1:38 PM Everette C wrote: Reason for CRM: Medication Refill - Most Recent Primary Care Visit:  Provider: Debera Lat Department: [facility] Visit Type: OFFICE VISIT Date: 04/09/2023  Medication: hydrochlorothiazide (HYDRODIURIL) 25 MG tablet [[id number]]  levothyroxine (SYNTHROID) 50 MCG tablet [[id number]]  losartan (COZAAR) 100 MG tablet [[id number]]  Has the patient contacted their pharmacy? Yes (Agent: If no, request that the patient contact the pharmacy for the refill. If patient does not wish to contact the pharmacy document the reason why and proceed with request.) (Agent: If yes, when and what did the pharmacy advise?)  Is this the correct pharmacy for this prescription? Yes If no, delete pharmacy and type the correct one.  This is the patient's preferred pharmacy:  [facility] [address] [place] - [address] [place] [address] Phone: [phone] Fax: [phone]   Has the prescription been filled recently? Yes  Is the patient out of the medication? Yes  Has the patient been seen for an appointment in the last year OR does the patient have an upcoming appointment? Yes  Can we respond through MyChart? No  Agent: Please be advised that Rx refills may take up to 3 business days. We ask that you follow-up with your pharmacy.

## 2023-06-17 NOTE — Telephone Encounter (Signed)
Requested medication (s) are due for refill today - provider review   Requested medication (s) are on the active medication list -yes  Future visit scheduled -yes  Last refill: all list 11/03/22  Notes to clinic: all medications listed are historical provider- require PCP review   Requested Prescriptions  Pending Prescriptions Disp Refills   hydrochlorothiazide (HYDRODIURIL) 25 MG tablet [Pharmacy Med Name: HYDROCHLOROTHIAZIDE 25 MG TAB] 90 tablet     Sig: TAKE 1 TABLET BY MOUTH EVERY DAY     Cardiovascular: Diuretics - Thiazide Failed - 06/17/2023  3:43 PM      Failed - Cr in normal range and within 180 days    Creatinine, Ser  Date Value Ref Range Status  02/26/2023 1.36 (H) 0.76 - 1.27 mg/dL Final         Passed - K in normal range and within 180 days    Potassium  Date Value Ref Range Status  02/26/2023 4.0 3.5 - 5.2 mmol/L Final         Passed - Na in normal range and within 180 days    Sodium  Date Value Ref Range Status  02/26/2023 138 134 - 144 mmol/L Final         Passed - Last BP in normal range    BP Readings from Last 1 Encounters:  04/09/23 132/80         Passed - Valid encounter within last 6 months    Recent Outpatient Visits           2 months ago Colon cancer screening   [place] [facility] [place], [place], PA-C   3 months ago Primary hypertension   [place] [facility] [place], [place], PA-C       Future Appointments             In 3 weeks Ostwalt, [place], PA-C [place] [facility], [facility]             losartan (COZAAR) 100 MG tablet [employer] Med Name: LOSARTAN POTASSIUM 100 MG TAB] 90 tablet     Sig: TAKE 1 TABLET BY MOUTH EVERY DAY     Cardiovascular:  Angiotensin Receptor Blockers Failed - 06/17/2023  3:43 PM      Failed - Cr in normal range and within 180 days    Creatinine, Ser  Date Value Ref Range Status  02/26/2023 1.36 (H) 0.76 - 1.27 mg/dL Final         Passed - K in normal  range and within 180 days    Potassium  Date Value Ref Range Status  02/26/2023 4.0 3.5 - 5.2 mmol/L Final         Passed - Patient is not pregnant      Passed - Last BP in normal range    BP Readings from Last 1 Encounters:  04/09/23 132/80         Passed - Valid encounter within last 6 months    Recent Outpatient Visits           2 months ago Colon cancer screening   [facility] [place], [place], PA-C   3 months ago Primary hypertension   [place] [facility] [place], [place], PA-C       Future Appointments             In 3 weeks Ostwalt, Edmon Crape, PA-C [place] [facility]  levothyroxine (SYNTHROID) 50 MCG tablet [Pharmacy Med Name: LEVOTHYROXINE 50 MCG TABLET] 90 tablet     Sig: TAKE 1 TABLET BY MOUTH EVERY DAY     Endocrinology:  Hypothyroid Agents Passed - 06/17/2023  3:43 PM      Passed - TSH in normal range and within 360 days    TSH  Date Value Ref Range Status  02/26/2023 1.660 0.450 - 4.500 uIU/mL Final         Passed - Valid encounter within last 12 months    Recent Outpatient Visits           2 months ago Colon cancer screening   [place] [facility] [place], [place], PA-C   3 months ago Primary hypertension   [place] [facility] [place], [place], PA-C       Future Appointments             In 3 weeks Ostwalt, [place], PA-C [place] [facility]               Requested Prescriptions  Pending Prescriptions Disp Refills   hydrochlorothiazide (HYDRODIURIL) 25 MG tablet [Pharmacy Med Name: HYDROCHLOROTHIAZIDE 25 MG TAB] 90 tablet     Sig: TAKE 1 TABLET BY MOUTH EVERY DAY     Cardiovascular: Diuretics - Thiazide Failed - 06/17/2023  3:43 PM      Failed - Cr in normal range and within 180 days    Creatinine, Ser  Date Value Ref Range Status  02/26/2023 1.36 (H) 0.76 - 1.27 mg/dL Final         Passed - K in  normal range and within 180 days    Potassium  Date Value Ref Range Status  02/26/2023 4.0 3.5 - 5.2 mmol/L Final         Passed - Na in normal range and within 180 days    Sodium  Date Value Ref Range Status  02/26/2023 138 134 - 144 mmol/L Final         Passed - Last BP in normal range    BP Readings from Last 1 Encounters:  04/09/23 132/80         Passed - Valid encounter within last 6 months    Recent Outpatient Visits           2 months ago Colon cancer screening   [place] [facility] [place], [place], PA-C   3 months ago Primary hypertension   [place] [facility] [place], [place], PA-C       Future Appointments             In 3 weeks Ostwalt, [place], PA-C [place] [facility]             losartan (COZAAR) 100 MG tablet [employer] Med Name: LOSARTAN POTASSIUM 100 MG TAB] 90 tablet     Sig: TAKE 1 TABLET BY MOUTH EVERY DAY     Cardiovascular:  Angiotensin Receptor Blockers Failed - 06/17/2023  3:43 PM      Failed - Cr in normal range and within 180 days    Creatinine, Ser  Date Value Ref Range Status  02/26/2023 1.36 (H) 0.76 - 1.27 mg/dL Final         Passed - K in normal range and within 180 days    Potassium  Date Value Ref Range Status  02/26/2023 4.0 3.5 - 5.2 mmol/L Final         Passed -  Patient is not pregnant      Passed - Last BP in normal range    BP Readings from Last 1 Encounters:  04/09/23 132/80         Passed - Valid encounter within last 6 months    Recent Outpatient Visits           2 months ago Colon cancer screening   [place] [facility] [place], [place], PA-C   3 months ago Primary hypertension   [place] [facility] [place], [place], PA-C       Future Appointments             In 3 weeks Debera Lat, PA-C [place] [facility]             levothyroxine (SYNTHROID) 50 MCG tablet [employer] Med Name:  LEVOTHYROXINE 50 MCG TABLET] 90 tablet     Sig: TAKE 1 TABLET BY MOUTH EVERY DAY     Endocrinology:  Hypothyroid Agents Passed - 06/17/2023  3:43 PM      Passed - TSH in normal range and within 360 days    TSH  Date Value Ref Range Status  02/26/2023 1.660 0.450 - 4.500 uIU/mL Final         Passed - Valid encounter within last 12 months    Recent Outpatient Visits           2 months ago Colon cancer screening   [place] [facility] [place], [place], PA-C   3 months ago Primary hypertension   [place] [facility] [place], [place], PA-C       Future Appointments             In 3 weeks Ostwalt, Edmon Crape, PA-C [facility], [facility]

## 2023-06-18 ENCOUNTER — Ambulatory Visit: Payer: Self-pay | Admitting: *Deleted

## 2023-06-18 MED ORDER — LOSARTAN POTASSIUM 100 MG PO TABS: 100.0000 mg | ORAL_TABLET | Freq: Every day | ORAL | 0 refills | Status: DC

## 2023-06-18 MED ORDER — LEVOTHYROXINE SODIUM 50 MCG PO TABS: 50.0000 ug | ORAL_TABLET | Freq: Every day | ORAL | 0 refills | Status: DC

## 2023-06-18 MED ORDER — HYDROCHLOROTHIAZIDE 25 MG PO TABS: 25.0000 mg | ORAL_TABLET | Freq: Every day | ORAL | 0 refills | Status: DC

## 2023-06-18 NOTE — Telephone Encounter (Signed)
There was an error in communication. Medications have been refilled and patient was notified.

## 2023-06-18 NOTE — Telephone Encounter (Signed)
It will be inappropriate to prescribe medications without seeing a patient, however, pt could see urgent care and they could prescribe him medications for a month/before his appointment with our clinic.

## 2023-06-18 NOTE — Addendum Note (Signed)
Addended by: Shirley Muscat on: 06/18/2023 01:45 PM   Modules accepted: Orders

## 2023-06-18 NOTE — Telephone Encounter (Signed)
  Chief Complaint: Pt is an established pt with Alejandro Lat, PA-C as of 02/26/2023.  He was also seen on 04/09/2023.   He has an upcoming appt in Feb. 2025.    Edmon Crape has recommended in her note for today 1/30 that he go to the urgent care to get his refills.   Not sure why this is being recommended.    [facility] unable to refill because they were last filled by a historical provider.     Symptoms: N/A Frequency: N/A Pertinent Negatives: Patient denies N/A Disposition: [] ED /[] Urgent Care (no appt availability in office) / [] Appointment(In office/virtual)/ []  [facility]/ [] Home Care/ [] Refused Recommended Disposition /[] [place] [facility]/ [x]  Follow-up with PCP Additional Notes: See triage notes for details and advise pt on the status of his medication refills due to Janna's response earlier today in chart.     Thanks.

## 2023-06-18 NOTE — Telephone Encounter (Signed)
Requested medication (s) are due for refill today:   Requested medication (s) are on the active medication list:   Last refill:    Future visit scheduled: Yes  Notes to clinic:  Refused by PCP.    Requested Prescriptions  Pending Prescriptions Disp Refills   hydrochlorothiazide (HYDRODIURIL) 25 MG tablet      Sig: Take 1 tablet (25 mg total) by mouth daily.     Cardiovascular: Diuretics - Thiazide Failed - 06/18/2023 12:34 PM      Failed - Cr in normal range and within 180 days    Creatinine, Ser  Date Value Ref Range Status  02/26/2023 1.36 (H) 0.76 - 1.27 mg/dL Final         Passed - K in normal range and within 180 days    Potassium  Date Value Ref Range Status  02/26/2023 4.0 3.5 - 5.2 mmol/L Final         Passed - Na in normal range and within 180 days    Sodium  Date Value Ref Range Status  02/26/2023 138 134 - 144 mmol/L Final         Passed - Last BP in normal range    BP Readings from Last 1 Encounters:  04/09/23 132/80         Passed - Valid encounter within last 6 months    Recent Outpatient Visits           2 months ago Colon cancer screening   [place] [facility] [place], [place], PA-C   3 months ago Primary hypertension   [place] [facility] [place], [place], PA-C       Future Appointments             In 3 weeks Ostwalt, [place], PA-C [place] [facility]             levothyroxine (SYNTHROID) 50 MCG tablet 30 tablet     Sig: Take 1 tablet (50 mcg total) by mouth daily.     Endocrinology:  Hypothyroid Agents Passed - 06/18/2023 12:34 PM      Passed - TSH in normal range and within 360 days    TSH  Date Value Ref Range Status  02/26/2023 1.660 0.450 - 4.500 uIU/mL Final         Passed - Valid encounter within last 12 months    Recent Outpatient Visits           2 months ago Colon cancer screening   [place] [facility] [place], [place], PA-C   3 months ago  Primary hypertension   [place] [facility] [place], [place], PA-C       Future Appointments             In 3 weeks Ostwalt, [place], PA-C [place] [facility]             losartan (COZAAR) 100 MG tablet      Sig: Take 1 tablet (100 mg total) by mouth daily.     Cardiovascular:  Angiotensin Receptor Blockers Failed - 06/18/2023 12:34 PM      Failed - Cr in normal range and within 180 days    Creatinine, Ser  Date Value Ref Range Status  02/26/2023 1.36 (H) 0.76 - 1.27 mg/dL Final         Passed - K in normal range and within 180 days    Potassium  Date Value Ref Range Status  02/26/2023 4.0 3.5 - 5.2 mmol/L Final         Passed - Patient is not pregnant      Passed - Last BP in normal range    BP Readings from Last 1 Encounters:  04/09/23 132/80         Passed - Valid encounter within last 6 months    Recent Outpatient Visits           2 months ago Colon cancer screening    [facility] [place], [place], PA-C   3 months ago Primary hypertension    [facility] [place], [place], PA-C       Future Appointments             In 3 weeks Ostwalt, Edmon Crape, PA-C [facility], [facility]

## 2023-06-18 NOTE — Telephone Encounter (Signed)
Message from [place] M sent at 06/18/2023 12:55 PM EST  Summary: Rx refill request was denied   Pt stated that he received a message from his pharmacy that his Rx refill request was denied. Pt had an appt on 04/09/23 and he has an upcoming appt on 07/10/23. Pt does not understand why the refill request was denied and requests call back to discuss options. Cb# [phone]          Call History  Contact Date/Time Type Contact Phone/Fax By  06/18/2023 12:51 PM EST Phone (Incoming) Alejandro Rush, Alejandro Rush (Self) [phone] Rexene Edison) Marylen Ponto   Reason for Disposition  [1] Caller has URGENT medicine question about med that PCP or specialist prescribed AND [2] triager unable to answer question  Answer Assessment - Initial Assessment Questions 1. NAME of MEDICINE: "What medicine(s) are you calling about?"     Hydrochlorothiazide 25 mg, Synthroid 50 mcg, Losartan 100   2. QUESTION: "What is your question?" (e.g., double dose of medicine, side effect)      This pt established as a new pt with Debera Lat, PA-C on 02/26/2023.  He was also seen on 04/09/2023 by [place].   He has an upcoming appt in Feb. 2025 with [place].    See Janna's note dated 06/18/2023 that he is to go to an urgent care to get his refills until he is seen at [facility].     PEC unable to refill the medications because his last refills were by his prior PCP 11/03/2022, historical provider.   He is calling back in wanting to know why his rx has been denied per his pharmacy?     I'm not sure Edmon Crape realized she has seen this pt and he is established with [facility] with her.    Please advise pt.    3. PRESCRIBER: "Who prescribed the medicine?" Reason: if prescribed by specialist, call should be referred to that group.      Prior PCP from 11/03/2022  listed as historical provider so [facility] unable to refill however per note from [place] dated today 06/18/2023 she is recommending he go to an urgent care and get his refills until he is seen [facility].    Not  sure why this is being suggested by [place] since he is established.      Please advise pt.  Thanks.  4. SYMPTOMS: "Do you have any symptoms?" If Yes, ask: "What symptoms are you having?"  "How bad are the symptoms (e.g., mild, moderate, severe)     N/A 5. PREGNANCY:  "Is there any chance that you are pregnant?" "When was your last menstrual period?"     N/A  Protocols used: Medication Question Call-A-AH

## 2023-07-10 ENCOUNTER — Ambulatory Visit: Payer: Self-pay | Admitting: Physician Assistant

## 2023-07-20 NOTE — Progress Notes (Unsigned)
 Established patient visit  Patient: Alejandro Rush   DOB: 1956-03-22   68 y.o. Male  MRN: [id number] Visit Date: 07/21/2023  Today's healthcare provider: Debera Lat, PA-C   No chief complaint on file.  Subjective       Discussed the use of AI scribe software for clinical note transcription with the patient, who gave verbal consent to proceed.  History of Present Illness         A1c 6.2       03/02/2023   10:43 AM  Depression screen PHQ 2/9  Decreased Interest 0  Down, Depressed, Hopeless 0  PHQ - 2 Score 0       No data to display          Medications: Outpatient Medications Prior to Visit  Medication Sig   amLODipine (NORVASC) 10 MG tablet Take 10 mg by mouth daily.   aspirin EC 81 MG EC tablet Take 1 tablet (81 mg total) by mouth daily.   atorvastatin (LIPITOR) 80 MG tablet Take 1 tablet (80 mg total) by mouth daily at 6 PM.   Cyanocobalamin (B-12 PO) Take by mouth.   doxazosin (CARDURA) 8 MG tablet Take 8 mg by mouth daily.   ferrous sulfate 324 MG TBEC Take 324 mg by mouth.   hydrochlorothiazide (HYDRODIURIL) 25 MG tablet Take 1 tablet (25 mg total) by mouth daily.   levothyroxine (SYNTHROID) 50 MCG tablet Take 1 tablet (50 mcg total) by mouth daily.   losartan (COZAAR) 100 MG tablet Take 1 tablet (100 mg total) by mouth daily.   meloxicam (MOBIC) 15 MG tablet Take 15 mg by mouth daily.   Multiple Vitamin (MULTIVITAMIN) tablet Take 1 tablet by mouth daily.   potassium chloride (KLOR-CON) 10 MEQ tablet Take 1 tablet (10 mEq total) by mouth daily.   No facility-administered medications prior to visit.    Review of Systems  All other systems reviewed and are negative.  All negative Except see HPI   {Insert previous labs (optional):23779} {See past labs  Heme  Chem  Endocrine  Serology  Results Review (optional):1}   Objective    There were no vitals taken for this visit. {Insert last BP/Wt (optional):23777}{See vitals history  (optional):1}   Physical Exam Vitals reviewed.  Constitutional:      General: He is not in acute distress.    Appearance: Normal appearance. He is not diaphoretic.  HENT:     Head: Normocephalic and atraumatic.  Eyes:     General: No scleral icterus.    Conjunctiva/sclera: Conjunctivae normal.  Cardiovascular:     Rate and Rhythm: Normal rate and regular rhythm.     Pulses: Normal pulses.     Heart sounds: Normal heart sounds. No murmur heard. Pulmonary:     Effort: Pulmonary effort is normal. No respiratory distress.     Breath sounds: Normal breath sounds. No wheezing or rhonchi.  Musculoskeletal:     Cervical back: Neck supple.     Right lower leg: No edema.     Left lower leg: No edema.  Lymphadenopathy:     Cervical: No cervical adenopathy.  Skin:    General: Skin is warm and dry.     Findings: No rash.  Neurological:     Mental Status: He is alert and oriented to person, place, and time. Mental status is at baseline.  Psychiatric:        Mood and Affect: Mood normal.        Behavior: Behavior normal.  No results found for any visits on 07/21/23.      Assessment and Plan             No orders of the defined types were placed in this encounter.   No follow-ups on file.   The patient was advised to call back or seek an in-person evaluation if the symptoms worsen or if the condition fails to improve as anticipated.  I discussed the assessment and treatment plan with the patient. The patient was provided an opportunity to ask questions and all were answered. The patient agreed with the plan and demonstrated an understanding of the instructions.  I, Debera Lat, PA-C have reviewed all documentation for this visit. The documentation on 07/21/2023  for the exam, diagnosis, procedures, and orders are all accurate and complete.  Debera Lat, [facility], MMS [facility] [phone] (phone) [phone] (fax)  [facility]

## 2023-07-21 ENCOUNTER — Ambulatory Visit (INDEPENDENT_AMBULATORY_CARE_PROVIDER_SITE_OTHER): Payer: 59 | Admitting: Physician Assistant

## 2023-07-21 ENCOUNTER — Encounter: Payer: Self-pay | Admitting: Physician Assistant

## 2023-07-21 VITALS — BP 160/86 | HR 67 | Resp 16 | Ht 72.0 in | Wt 229.0 lb

## 2023-07-21 DIAGNOSIS — E7849 Other hyperlipidemia: Secondary | ICD-10-CM

## 2023-07-21 DIAGNOSIS — Z9189 Other specified personal risk factors, not elsewhere classified: Secondary | ICD-10-CM | POA: Diagnosis not present

## 2023-07-21 DIAGNOSIS — E66811 Obesity, class 1: Secondary | ICD-10-CM

## 2023-07-21 DIAGNOSIS — F17210 Nicotine dependence, cigarettes, uncomplicated: Secondary | ICD-10-CM

## 2023-07-21 DIAGNOSIS — M2550 Pain in unspecified joint: Secondary | ICD-10-CM

## 2023-07-21 DIAGNOSIS — R7303 Prediabetes: Secondary | ICD-10-CM

## 2023-07-21 DIAGNOSIS — I10 Essential (primary) hypertension: Secondary | ICD-10-CM | POA: Diagnosis not present

## 2023-07-27 ENCOUNTER — Other Ambulatory Visit: Payer: Self-pay | Admitting: Physician Assistant

## 2023-07-27 NOTE — Telephone Encounter (Signed)
 Copied from CRM [id number]. Topic: Clinical - Medication Refill >> Jul 27, 2023  9:22 AM Geroge Baseman wrote: Most Recent Primary Care Visit:  Provider: Debera Lat  Department: [facility]  Visit Type: OFFICE VISIT  Date: 07/21/2023  Medication: amLODipine (NORVASC) 10 MG tablet, levothyroxine (SYNTHROID) 50 MCG tablet, losartan (COZAAR) 100 MG tablet   Has the patient contacted their pharmacy? Yes (Agent: If no, request that the patient contact the pharmacy for the refill. If patient does not wish to contact the pharmacy document the reason why and proceed with request.) (Agent: If yes, when and what did the pharmacy advise?)  Is this the correct pharmacy for this prescription? Yes If no, delete pharmacy and type the correct one.  This is the patient's preferred pharmacy:  [facility] [address] [place] - [address] [place] [address] Phone: [phone] Fax: [phone]   Has the prescription been filled recently? No  Is the patient out of the medication? Yes  Has the patient been seen for an appointment in the last year OR does the patient have an upcoming appointment? Yes  Can we respond through MyChart? No  Agent: Please be advised that Rx refills may take up to 3 business days. We ask that you follow-up with your pharmacy.

## 2023-07-27 NOTE — Telephone Encounter (Unsigned)
 Copied from CRM [id number]. Topic: Clinical - Medication Refill >> Jul 27, 2023  9:22 AM Geroge Baseman wrote: Most Recent Primary Care Visit:  Provider: Debera Lat  Department: [facility]  Visit Type: OFFICE VISIT  Date: 07/21/2023  Medication: amLODipine (NORVASC) 10 MG tablet, levothyroxine (SYNTHROID) 50 MCG tablet, losartan (COZAAR) 100 MG tablet   Has the patient contacted their pharmacy? Yes (Agent: If no, request that the patient contact the pharmacy for the refill. If patient does not wish to contact the pharmacy document the reason why and proceed with request.) (Agent: If yes, when and what did the pharmacy advise?)  Is this the correct pharmacy for this prescription? Yes If no, delete pharmacy and type the correct one.  This is the patient's preferred pharmacy:  [facility] [address] [place] - [address] [place] [address] Phone: [phone] Fax: [phone]   Has the prescription been filled recently? No  Is the patient out of the medication? Yes  Has the patient been seen for an appointment in the last year OR does the patient have an upcoming appointment? Yes  Can we respond through MyChart? No  Agent: Please be advised that Rx refills may take up to 3 business days. We ask that you follow-up with your pharmacy.

## 2023-07-28 MED ORDER — LEVOTHYROXINE SODIUM 50 MCG PO TABS: 50.0000 ug | ORAL_TABLET | Freq: Every day | ORAL | 0 refills | Status: DC

## 2023-07-28 MED ORDER — AMLODIPINE BESYLATE 10 MG PO TABS: 10.0000 mg | ORAL_TABLET | Freq: Every day | ORAL | 0 refills | Status: DC

## 2023-07-28 MED ORDER — LOSARTAN POTASSIUM 100 MG PO TABS: 100.0000 mg | ORAL_TABLET | Freq: Every day | ORAL | 0 refills | Status: DC

## 2023-07-28 NOTE — Telephone Encounter (Signed)
 Notes to clinic: Duplicate request- see previous request  Requested Prescriptions  Pending Prescriptions Disp Refills   amLODipine (NORVASC) 10 MG tablet      Sig: Take 1 tablet (10 mg total) by mouth daily.     Cardiovascular: Calcium Channel Blockers 2 Failed - 07/28/2023 12:47 PM      Failed - Last BP in normal range    BP Readings from Last 1 Encounters:  07/21/23 (!) 160/86         Passed - Last Heart Rate in normal range    Pulse Readings from Last 1 Encounters:  07/21/23 67         Passed - Valid encounter within last 6 months    Recent Outpatient Visits           3 months ago Colon cancer screening   [place] [facility] [place], [place], PA-C   5 months ago Primary hypertension   [place] [facility] [place], [place], PA-C       Future Appointments             In 1 week [place], [place], PA-C [place] [facility]             levothyroxine (SYNTHROID) 50 MCG tablet 90 tablet 0    Sig: Take 1 tablet (50 mcg total) by mouth daily.     Endocrinology:  Hypothyroid Agents Passed - 07/28/2023 12:47 PM      Passed - TSH in normal range and within 360 days    TSH  Date Value Ref Range Status  02/26/2023 1.660 0.450 - 4.500 uIU/mL Final         Passed - Valid encounter within last 12 months    Recent Outpatient Visits           3 months ago Colon cancer screening   [facility]  [place], [place], PA-C   5 months ago Primary hypertension   [place] [facility] [place], [place], PA-C       Future Appointments             In 1 week [place], [place], PA-C [place] [facility]             losartan (COZAAR) 100 MG tablet 90 tablet 0    Sig: Take 1 tablet (100 mg total) by mouth daily.     Cardiovascular:  Angiotensin Receptor Blockers Failed - 07/28/2023 12:47 PM      Failed - Cr in normal range and within 180 days    Creatinine, Ser   Date Value Ref Range Status  02/26/2023 1.36 (H) 0.76 - 1.27 mg/dL Final         Failed - Last BP in normal range    BP Readings from Last 1 Encounters:  07/21/23 (!) 160/86         Passed - K in normal range and within 180 days    Potassium  Date Value Ref Range Status  02/26/2023 4.0 3.5 - 5.2 mmol/L Final         Passed - Patient is not pregnant      Passed - Valid encounter within last 6 months    Recent Outpatient Visits           3 months ago Colon cancer screening   [place] [facility] [place], [place], PA-C   5 months ago Primary hypertension   [place] [facility] [place],  Edmon Crape, PA-C       Future Appointments             In 1 week Debera Lat, PA-C [place] [facility]               Requested Prescriptions  Pending Prescriptions Disp Refills   amLODipine (NORVASC) 10 MG tablet      Sig: Take 1 tablet (10 mg total) by mouth daily.     Cardiovascular: Calcium Channel Blockers 2 Failed - 07/28/2023 12:47 PM      Failed - Last BP in normal range    BP Readings from Last 1 Encounters:  07/21/23 (!) 160/86         Passed - Last Heart Rate in normal range    Pulse Readings from Last 1 Encounters:  07/21/23 67         Passed - Valid encounter within last 6 months    Recent Outpatient Visits           3 months ago Colon cancer screening   [place] [facility] [place], [place], PA-C   5 months ago Primary hypertension   [place] [facility] [place], [place], PA-C       Future Appointments             In 1 week [place], [place], PA-C [place] [facility]             levothyroxine (SYNTHROID) 50 MCG tablet 90 tablet 0    Sig: Take 1 tablet (50 mcg total) by mouth daily.     Endocrinology:  Hypothyroid Agents Passed - 07/28/2023 12:47 PM      Passed - TSH in normal range and within 360 days    TSH  Date Value Ref Range  Status  02/26/2023 1.660 0.450 - 4.500 uIU/mL Final         Passed - Valid encounter within last 12 months    Recent Outpatient Visits           3 months ago Colon cancer screening   [place] [place], [place], PA-C   5 months ago Primary hypertension   [place] [facility] [place], [place], PA-C       Future Appointments             In 1 week Ostwalt, [place], PA-C [place] [facility]             losartan (COZAAR) 100 MG tablet 90 tablet 0    Sig: Take 1 tablet (100 mg total) by mouth daily.     Cardiovascular:  Angiotensin Receptor Blockers Failed - 07/28/2023 12:47 PM      Failed - Cr in normal range and within 180 days    Creatinine, Ser  Date Value Ref Range Status  02/26/2023 1.36 (H) 0.76 - 1.27 mg/dL Final         Failed - Last BP in normal range    BP Readings from Last 1 Encounters:  07/21/23 (!) 160/86         Passed - K in normal range and within 180 days    Potassium  Date Value Ref Range Status  02/26/2023 4.0 3.5 - 5.2 mmol/L Final         Passed - Patient is not pregnant      Passed - Valid encounter within last 6 months    Recent Outpatient Visits  3 months ago Colon cancer screening   [place] [facility] [place], [place], [place]   5 months ago Primary hypertension   [place] [facility] [place], [place], PA-C       Future Appointments             In 1 week Debera Lat, PA-C [place] [facility], [place]

## 2023-07-28 NOTE — Telephone Encounter (Signed)
 Requested medication (s) are due for refill today - please review  Requested medication (s) are on the active medication list - yes  Future visit scheduled -yes  Last refill: amlodipine- 06/18/23 #90 -listed as historical                 Levothyroxine- 06/18/23 #90- listed as phone in                 Losartan- 06/18/23 #90- listed as phone in  Notes to clinic: see above- per last visit- patient should continue  Requested Prescriptions  Pending Prescriptions Disp Refills   amLODipine (NORVASC) 10 MG tablet      Sig: Take 1 tablet (10 mg total) by mouth daily.     Cardiovascular: Calcium Channel Blockers 2 Failed - 07/28/2023 12:42 PM      Failed - Last BP in normal range    BP Readings from Last 1 Encounters:  07/21/23 (!) 160/86         Passed - Last Heart Rate in normal range    Pulse Readings from Last 1 Encounters:  07/21/23 67         Passed - Valid encounter within last 6 months    Recent Outpatient Visits           3 months ago Colon cancer screening   [place] [facility] [place], [place], PA-C   5 months ago Primary hypertension   [place] [facility] [place], [place], PA-C       Future Appointments             In 1 week [place], [place], PA-C [place] [facility]             levothyroxine (SYNTHROID) 50 MCG tablet 90 tablet 0    Sig: Take 1 tablet (50 mcg total) by mouth daily.     Endocrinology:  Hypothyroid Agents Passed - 07/28/2023 12:42 PM      Passed - TSH in normal range and within 360 days    TSH  Date Value Ref Range Status  02/26/2023 1.660 0.450 - 4.500 uIU/mL Final         Passed - Valid encounter within last 12 months    Recent Outpatient Visits           3 months ago Colon cancer screening   [place] [facility] [place], [place], PA-C   5 months ago Primary hypertension   [place] [facility] [place], [place], PA-C       Future Appointments              In 1 week Ostwalt, [place], PA-C [place] [facility]             losartan (COZAAR) 100 MG tablet 90 tablet 0    Sig: Take 1 tablet (100 mg total) by mouth daily.     Cardiovascular:  Angiotensin Receptor Blockers Failed - 07/28/2023 12:42 PM      Failed - Cr in normal range and within 180 days    Creatinine, Ser  Date Value Ref Range Status  02/26/2023 1.36 (H) 0.76 - 1.27 mg/dL Final         Failed - Last BP in normal range    BP Readings from Last 1 Encounters:  07/21/23 (!) 160/86         Passed - K in normal range and within 180 days    Potassium  Date  Value Ref Range Status  02/26/2023 4.0 3.5 - 5.2 mmol/L Final         Passed - Patient is not pregnant      Passed - Valid encounter within last 6 months    Recent Outpatient Visits           3 months ago Colon cancer screening   [facility] [place], [place], PA-C   5 months ago Primary hypertension   [place] [facility] [place], [place], PA-C       Future Appointments             In 1 week [place]walt, [place], PA-C [place] [facility], [facility]               Requested Prescriptions  Pending Prescriptions Disp Refills   amLODipine (NORVASC) 10 MG tablet      Sig: Take 1 tablet (10 mg total) by mouth daily.     Cardiovascular: Calcium Channel Blockers 2 Failed - 07/28/2023 12:42 PM      Failed - Last BP in normal range    BP Readings from Last 1 Encounters:  07/21/23 (!) 160/86         Passed - Last Heart Rate in normal range    Pulse Readings from Last 1 Encounters:  07/21/23 67         Passed - Valid encounter within last 6 months    Recent Outpatient Visits           3 months ago Colon cancer screening   [place] [facility] [place], [place], PA-C   5 months ago Primary hypertension   [place] [place], [place], PA-C       Future Appointments              In 1 week [place], [place], PA-C [place] [facility]             levothyroxine (SYNTHROID) 50 MCG tablet 90 tablet 0    Sig: Take 1 tablet (50 mcg total) by mouth daily.     Endocrinology:  Hypothyroid Agents Passed - 07/28/2023 12:42 PM      Passed - TSH in normal range and within 360 days    TSH  Date Value Ref Range Status  02/26/2023 1.660 0.450 - 4.500 uIU/mL Final         Passed - Valid encounter within last 12 months    Recent Outpatient Visits           3 months ago Colon cancer screening   [place] [facility] [place], [place], PA-C   5 months ago Primary hypertension   [place] [facility] [place], [place], PA-C       Future Appointments             In 1 week Ostwalt, [place], PA-C  [facility]             losartan (COZAAR) 100 MG tablet 90 tablet 0    Sig: Take 1 tablet (100 mg total) by mouth daily.     Cardiovascular:  Angiotensin Receptor Blockers Failed - 07/28/2023 12:42 PM      Failed - Cr in normal range and within 180 days    Creatinine, Ser  Date Value Ref Range Status  02/26/2023 1.36 (H) 0.76 - 1.27 mg/dL Final         Failed - Last BP in normal  range    BP Readings from Last 1 Encounters:  07/21/23 (!) 160/86         Passed - K in normal range and within 180 days    Potassium  Date Value Ref Range Status  02/26/2023 4.0 3.5 - 5.2 mmol/L Final         Passed - Patient is not pregnant      Passed - Valid encounter within last 6 months    Recent Outpatient Visits           3 months ago Colon cancer screening   [place] [facility] [place], [place], PA-C   5 months ago Primary hypertension   [place] [facility] [place], [place], PA-C       Future Appointments             In 1 week Debera Lat, PA-C [facility], [facility]

## 2023-08-07 ENCOUNTER — Ambulatory Visit: Admitting: Physician Assistant

## 2023-08-07 VITALS — BP 121/83 | HR 72 | Ht 72.0 in | Wt 230.8 lb

## 2023-08-07 DIAGNOSIS — E7849 Other hyperlipidemia: Secondary | ICD-10-CM | POA: Diagnosis not present

## 2023-08-07 DIAGNOSIS — Z9189 Other specified personal risk factors, not elsewhere classified: Secondary | ICD-10-CM

## 2023-08-07 DIAGNOSIS — F17210 Nicotine dependence, cigarettes, uncomplicated: Secondary | ICD-10-CM

## 2023-08-07 DIAGNOSIS — I10 Essential (primary) hypertension: Secondary | ICD-10-CM

## 2023-08-07 DIAGNOSIS — G629 Polyneuropathy, unspecified: Secondary | ICD-10-CM

## 2023-08-07 NOTE — Progress Notes (Signed)
 Established patient visit  Patient: Alejandro Rush   DOB: 1955-09-10   68 y.o. Male  MRN: [id number] Visit Date: 08/07/2023  Today's healthcare provider: Debera Lat, PA-C   Chief Complaint  Patient presents with   Medical Management of Chronic Issues   Hyperlipidemia   Hypertension    Pt reports monitoring at home and no symptoms to report   Hypothyroidism    No symptoms to report   Pre-Diabetes    No symptoms to report   Subjective      Discussed the use of AI scribe software for clinical note transcription with the patient, who gave verbal consent to proceed.  History of Present Illness The patient, with a history of hypertension, presents for a follow-up visit. He reports that his blood pressure has been improving since starting a new medication, although he cannot recall the name of the drug. He is currently taking amlodipine 10mg , hydrochlorothiazide 25mg , and losartan 100mg  daily. He has also made dietary changes, including reducing his intake of salty and spicy foods and increasing his water consumption.  The patient is also on atorvastatin for cholesterol management. His last lipid panel, conducted in June 2024, showed slightly elevated levels. He has not eaten today and is due for another lipid panel.  The patient continues to smoke one pack of cigarettes per day, despite being aware of the associated health risks. He has been advised to consider quitting and has been provided with resources to assist in this process.  The patient reports drinking approximately eight beers over the past week. He does not drink every day.  The patient also reports persistent numbness and tingling in his arms, which he attributes to a previous minor stroke. This symptom has not improved over time.       07/21/2023   11:07 AM 03/02/2023   10:43 AM  Depression screen PHQ 2/9  Decreased Interest 0 0  Down, Depressed, Hopeless 0 0  PHQ - 2 Score 0 0  Altered sleeping 2   Tired,  decreased energy 1   Change in appetite 0   Feeling bad or failure about yourself  0   Trouble concentrating 0   Moving slowly or fidgety/restless 0   Suicidal thoughts 0   PHQ-9 Score 3   Difficult doing work/chores Somewhat difficult       07/21/2023   11:07 AM  GAD 7 : Generalized Anxiety Score  Nervous, Anxious, on Edge 0  Control/stop worrying 0  Worry too much - different things 0  Trouble relaxing 0  Restless 0  Easily annoyed or irritable 0  Afraid - awful might happen 0  Total GAD 7 Score 0  Anxiety Difficulty Somewhat difficult    Medications: Outpatient Medications Prior to Visit  Medication Sig   amLODipine (NORVASC) 10 MG tablet Take 1 tablet (10 mg total) by mouth daily.   aspirin EC 81 MG EC tablet Take 1 tablet (81 mg total) by mouth daily.   Cholecalciferol (VITAMIN D3) 25 MCG (1000 UT) CAPS Take by mouth.   Cyanocobalamin (B-12 PO) Take by mouth.   doxazosin (CARDURA) 8 MG tablet Take 8 mg by mouth daily.   ferrous sulfate 324 MG TBEC Take 324 mg by mouth.   hydrochlorothiazide (HYDRODIURIL) 25 MG tablet Take 1 tablet (25 mg total) by mouth daily.   levothyroxine (SYNTHROID) 50 MCG tablet Take 1 tablet (50 mcg total) by mouth daily.   losartan (COZAAR) 100 MG tablet Take 1 tablet (100 mg total) by mouth  daily.   meloxicam (MOBIC) 15 MG tablet Take 15 mg by mouth daily.   potassium chloride (KLOR-CON) 10 MEQ tablet Take 1 tablet (10 mEq total) by mouth daily.   atorvastatin (LIPITOR) 80 MG tablet Take 1 tablet (80 mg total) by mouth daily at 6 PM.   Multiple Vitamin (MULTIVITAMIN) tablet Take 1 tablet by mouth daily.   pregabalin (LYRICA) 100 MG capsule Take 100 mg by mouth at bedtime.   No facility-administered medications prior to visit.    Review of Systems All negative Except see HPI       Objective    BP 121/83 (BP Location: Left Arm, Patient Position: Sitting, Cuff Size: Normal)   Pulse 72   Ht 6' (1.829 m)   Wt 230 lb 12.8 oz (104.7 kg)    SpO2 99%   BMI 31.30 kg/m     Physical Exam Vitals reviewed.  Constitutional:      General: He is not in acute distress.    Appearance: Normal appearance. He is not diaphoretic.  HENT:     Head: Normocephalic and atraumatic.  Eyes:     General: No scleral icterus.    Conjunctiva/sclera: Conjunctivae normal.  Cardiovascular:     Rate and Rhythm: Normal rate and regular rhythm.     Pulses: Normal pulses.     Heart sounds: Normal heart sounds. No murmur heard. Pulmonary:     Effort: Pulmonary effort is normal. No respiratory distress.     Breath sounds: Normal breath sounds. No wheezing or rhonchi.  Musculoskeletal:     Cervical back: Neck supple.     Right lower leg: No edema.     Left lower leg: No edema.  Lymphadenopathy:     Cervical: No cervical adenopathy.  Skin:    General: Skin is warm and dry.     Findings: No rash.  Neurological:     Mental Status: He is alert and oriented to person, place, and time. Mental status is at baseline.  Psychiatric:        Mood and Affect: Mood normal.        Behavior: Behavior normal.      Results for orders placed or performed in visit on 08/07/23  Lipid Panel With LDL/HDL Ratio  Result Value Ref Range   Cholesterol, Total 137 100 - 199 mg/dL   Triglycerides 72 0 - 149 mg/dL   HDL 49 >29 mg/dL   VLDL Cholesterol Cal 14 5 - 40 mg/dL   LDL Chol Calc (NIH) 74 0 - 99 mg/dL   LDL/HDL Ratio 1.5 0.0 - 3.6 ratio  Comprehensive metabolic panel  Result Value Ref Range   Glucose 100 (H) 70 - 99 mg/dL   BUN 27 8 - 27 mg/dL   Creatinine, Ser 5.62 0.76 - 1.27 mg/dL   eGFR 67 >13 YQ/MVH/8.46   BUN/Creatinine Ratio 23 10 - 24   Sodium 140 134 - 144 mmol/L   Potassium 4.2 3.5 - 5.2 mmol/L   Chloride 103 96 - 106 mmol/L   CO2 22 20 - 29 mmol/L   Calcium 9.9 8.6 - 10.2 mg/dL   Total Protein 6.7 6.0 - 8.5 g/dL   Albumin 4.5 3.9 - 4.9 g/dL   Globulin, Total 2.2 1.5 - 4.5 g/dL   Bilirubin Total 0.4 0.0 - 1.2 mg/dL   Alkaline Phosphatase  90 44 - 121 IU/L   AST 24 0 - 40 IU/L   ALT 21 0 - 44 IU/L  TSH  Result Value  Ref Range   TSH 1.760 0.450 - 4.500 uIU/mL        Assessment and Plan Assessment & Plan Hypertension chronic Hypertension well-controlled with current medications. Emphasized medication adherence and lifestyle modifications. - Continue amlodipine10, hydrochlorothiazide25, losartan100 - Encourage reduction of salt and spicy food intake. - Increase water intake. - Monitor blood pressure regularly.  Hyperlipidemia chronic Cholesterol levels slightly elevated in June 2024. Repeat lipid panel needed. Acknowledged importance of fasting before test.continue atorvastatin80 The 10-year ASCVD risk score (Arnett DK, et al., 2019) is: 18.3% - Order lipid panel today as he is fasting. - Continue atorvastatin.  Neuropathy chronic Persistent tingling and numbness in arms attributed to previous mild stroke. No new neurological symptoms reported. - Monitor for new neurological symptoms. - Follow up with neurology as needed. Will follow-up  Tobacco use disorder chronic Continues to smoke one pack per day. Discussed risks including cancer and cardiovascular issues. Provided resources for smoking cessation. - Encourage smoking cessation. - Provide resources such as Quit Smoking site, patches, gums, and Chantix if he is ready.  Alcohol use chronic Reports drinking approximately eight beers per week, reduced from previous consumption. Discussed importance of moderating alcohol intake. - Encourage moderation in alcohol consumption.  General Health Maintenance Has not received shingles vaccine and has history of shingles. Discussed shingles and COVID vaccination options. - Discuss shingles and COVID vaccination options. - Administer vaccines if he consents.   Orders Placed This Encounter  Procedures   Lipid Panel With LDL/HDL Ratio   Comprehensive metabolic panel   TSH    No follow-ups on file.   The  patient was advised to call back or seek an in-person evaluation if the symptoms worsen or if the condition fails to improve as anticipated.  I discussed the assessment and treatment plan with the patient. The patient was provided an opportunity to ask questions and all were answered. The patient agreed with the plan and demonstrated an understanding of the instructions.  I, Debera Lat, PA-C have reviewed all documentation for this visit. The documentation on 08/07/2023  for the exam, diagnosis, procedures, and orders are all accurate and complete.  Debera Lat, [facility], MMS [facility] [phone] (phone) [phone] (fax)  [facility]

## 2023-08-08 ENCOUNTER — Encounter: Payer: Self-pay | Admitting: Physician Assistant

## 2023-08-08 LAB — COMPREHENSIVE METABOLIC PANEL
ALT: 21 IU/L (ref 0–44)
AST: 24 IU/L (ref 0–40)
Albumin: 4.5 g/dL (ref 3.9–4.9)
Alkaline Phosphatase: 90 IU/L (ref 44–121)
BUN/Creatinine Ratio: 23 (ref 10–24)
BUN: 27 mg/dL (ref 8–27)
Bilirubin Total: 0.4 mg/dL (ref 0.0–1.2)
CO2: 22 mmol/L (ref 20–29)
Calcium: 9.9 mg/dL (ref 8.6–10.2)
Chloride: 103 mmol/L (ref 96–106)
Creatinine, Ser: 1.17 mg/dL (ref 0.76–1.27)
Globulin, Total: 2.2 g/dL (ref 1.5–4.5)
Glucose: 100 mg/dL — ABNORMAL HIGH (ref 70–99)
Potassium: 4.2 mmol/L (ref 3.5–5.2)
Sodium: 140 mmol/L (ref 134–144)
Total Protein: 6.7 g/dL (ref 6.0–8.5)
eGFR: 67 mL/min/{1.73_m2} (ref >59)

## 2023-08-08 LAB — LIPID PANEL WITH LDL/HDL RATIO
Cholesterol, Total: 137 mg/dL (ref 100–199)
HDL: 49 mg/dL (ref >39)
LDL Chol Calc (NIH): 74 mg/dL (ref 0–99)
LDL/HDL Ratio: 1.5 ratio (ref 0.0–3.6)
Triglycerides: 72 mg/dL (ref 0–149)
VLDL Cholesterol Cal: 14 mg/dL (ref 5–40)

## 2023-08-08 LAB — TSH: TSH: 1.76 u[IU]/mL (ref 0.450–4.500)

## 2023-08-09 ENCOUNTER — Encounter: Payer: Self-pay | Admitting: Physician Assistant

## 2023-08-12 ENCOUNTER — Other Ambulatory Visit: Payer: Self-pay | Admitting: Physician Assistant

## 2023-08-12 DIAGNOSIS — I10 Essential (primary) hypertension: Secondary | ICD-10-CM

## 2023-08-13 NOTE — Telephone Encounter (Signed)
 Requested medication (s) are due for refill today: review  Requested medication (s) are on the active medication list: yes  Last refill:  11/03/22  Future visit scheduled: yes  Notes to clinic:  Historical provider/medication, please review for refill       Requested Prescriptions  Pending Prescriptions Disp Refills   doxazosin (CARDURA) 8 MG tablet [Pharmacy Med Name: DOXAZOSIN MESYLATE 8 MG TAB] 90 tablet     Sig: TAKE 1 TABLET BY MOUTH EVERY DAY     Cardiovascular:  Alpha Blockers Passed - 08/13/2023  4:33 PM      Passed - Last BP in normal range    BP Readings from Last 1 Encounters:  08/07/23 121/83         Passed - Valid encounter within last 6 months    Recent Outpatient Visits           6 days ago Primary hypertension   [facility] [place], [place], PA-C   3 weeks ago At risk alcohol consumption   [place] [facility] [place], [place], PA-C       Future Appointments             In 2 months Ostw[place], Edmon Crape, PA-C [facility]

## 2023-08-14 NOTE — Telephone Encounter (Signed)
 LOV 08/07/23 NOV 11/06/23 LRF 09/30/22 LABS 08/07/23

## 2023-09-13 ENCOUNTER — Other Ambulatory Visit: Payer: Self-pay | Admitting: Physician Assistant

## 2023-09-15 NOTE — Telephone Encounter (Signed)
 Requested Prescriptions  Pending Prescriptions Disp Refills   hydrochlorothiazide  (HYDRODIURIL ) 25 MG tablet [Pharmacy Med Name: HYDROCHLOROTHIAZIDE  25 MG TAB] 90 tablet 0    Sig: TAKE 1 TABLET (25 MG TOTAL) BY MOUTH DAILY.     There is no refill protocol information for this order

## 2023-09-16 DIAGNOSIS — M170 Bilateral primary osteoarthritis of knee: Secondary | ICD-10-CM | POA: Diagnosis not present

## 2023-09-30 DIAGNOSIS — M170 Bilateral primary osteoarthritis of knee: Secondary | ICD-10-CM | POA: Diagnosis not present

## 2023-09-30 DIAGNOSIS — M25511 Pain in right shoulder: Secondary | ICD-10-CM | POA: Diagnosis not present

## 2023-10-21 DIAGNOSIS — R209 Unspecified disturbances of skin sensation: Secondary | ICD-10-CM | POA: Diagnosis not present

## 2023-10-21 DIAGNOSIS — I69398 Other sequelae of cerebral infarction: Secondary | ICD-10-CM | POA: Diagnosis not present

## 2023-10-21 DIAGNOSIS — E559 Vitamin D deficiency, unspecified: Secondary | ICD-10-CM | POA: Diagnosis not present

## 2023-10-23 ENCOUNTER — Other Ambulatory Visit: Payer: Self-pay | Admitting: Cardiology

## 2023-10-24 ENCOUNTER — Other Ambulatory Visit: Payer: Self-pay | Admitting: Physician Assistant

## 2023-11-06 ENCOUNTER — Other Ambulatory Visit: Payer: Self-pay | Admitting: Cardiology

## 2023-11-06 ENCOUNTER — Ambulatory Visit: Admitting: Physician Assistant

## 2023-11-06 ENCOUNTER — Other Ambulatory Visit: Payer: Self-pay | Admitting: Physician Assistant

## 2023-12-12 ENCOUNTER — Other Ambulatory Visit: Payer: Self-pay | Admitting: Physician Assistant

## 2023-12-17 ENCOUNTER — Ambulatory Visit: Admitting: Physician Assistant

## 2023-12-17 NOTE — Progress Notes (Deleted)
 Established patient visit  Patient: Alejandro Rush   DOB: [birth year]   68 y.o. Male  MRN: [id number] Visit Date: 12/17/2023  Today's healthcare provider: Jolynn Spencer, PA-C   No chief complaint on file.  Subjective       Discussed the use of AI scribe software for clinical note transcription with the patient, who gave verbal consent to proceed.  History of Present Illness        07/21/2023   11:07 AM 03/02/2023   10:43 AM  Depression screen PHQ 2/9  Decreased Interest 0 0  Down, Depressed, Hopeless 0 0  PHQ - 2 Score 0 0  Altered sleeping 2   Tired, decreased energy 1   Change in appetite 0   Feeling bad or failure about yourself  0   Trouble concentrating 0   Moving slowly or fidgety/restless 0   Suicidal thoughts 0   PHQ-9 Score 3   Difficult doing work/chores Somewhat difficult       07/21/2023   11:07 AM  GAD 7 : Generalized Anxiety Score  Nervous, Anxious, on Edge 0  Control/stop worrying 0  Worry too much - different things 0  Trouble relaxing 0  Restless 0  Easily annoyed or irritable 0  Afraid - awful might happen 0  Total GAD 7 Score 0  Anxiety Difficulty Somewhat difficult    Medications: Outpatient Medications Prior to Visit  Medication Sig   amLODipine  (NORVASC ) 10 MG tablet Take 1 tablet (10 mg total) by mouth daily.   aspirin  EC 81 MG EC tablet Take 1 tablet (81 mg total) by mouth daily.   atorvastatin  (LIPITOR) 80 MG tablet Take 1 tablet (80 mg total) by mouth daily at 6 PM.   Cholecalciferol (VITAMIN D3) 25 MCG (1000 UT) CAPS Take by mouth.   Cyanocobalamin  (B-12 PO) Take by mouth.   doxazosin (CARDURA) 8 MG tablet TAKE 1 TABLET BY MOUTH EVERY DAY   ferrous sulfate 324 MG TBEC Take 324 mg by mouth.   hydrochlorothiazide  (HYDRODIURIL ) 25 MG tablet TAKE 1 TABLET (25 MG TOTAL) BY MOUTH DAILY.   levothyroxine  (SYNTHROID ) 50 MCG tablet TAKE 1 TABLET BY MOUTH EVERY DAY   losartan  (COZAAR ) 100 MG tablet TAKE 1 TABLET BY MOUTH EVERY DAY    meloxicam (MOBIC) 15 MG tablet Take 15 mg by mouth daily.   Multiple Vitamin (MULTIVITAMIN) tablet Take 1 tablet by mouth daily.   potassium chloride  (KLOR-CON ) 10 MEQ tablet TAKE 1 TABLET BY MOUTH EVERY DAY   pregabalin (LYRICA) 100 MG capsule Take 100 mg by mouth at bedtime.   No facility-administered medications prior to visit.    Review of Systems  All other systems reviewed and are negative.  All negative Except see HPI   {Insert previous labs (optional):23779} {See past labs  Heme  Chem  Endocrine  Serology  Results Review (optional):1}   Objective    There were no vitals taken for this visit. {Insert last BP/Wt (optional):23777}{See vitals history (optional):1}   Physical Exam Vitals reviewed.  Constitutional:      General: He is not in acute distress.    Appearance: Normal appearance. He is not diaphoretic.  HENT:     Head: Normocephalic and atraumatic.  Eyes:     General: No scleral icterus.    Conjunctiva/sclera: Conjunctivae normal.  Cardiovascular:     Rate and Rhythm: Normal rate and regular rhythm.     Pulses: Normal pulses.     Heart sounds: Normal heart sounds. No murmur heard.  Pulmonary:     Effort: Pulmonary effort is normal. No respiratory distress.     Breath sounds: Normal breath sounds. No wheezing or rhonchi.  Musculoskeletal:     Cervical back: Neck supple.     Right lower leg: No edema.     Left lower leg: No edema.  Lymphadenopathy:     Cervical: No cervical adenopathy.  Skin:    General: Skin is warm and dry.     Findings: No rash.  Neurological:     Mental Status: He is alert and oriented to person, place, and time. Mental status is at baseline.  Psychiatric:        Mood and Affect: Mood normal.        Behavior: Behavior normal.      No results found for any visits on 12/17/23.      Assessment and Plan Assessment & Plan     No orders of the defined types were placed in this encounter.   No follow-ups on file.   The  patient was advised to call back or seek an in-person evaluation if the symptoms worsen or if the condition fails to improve as anticipated.  I discussed the assessment and treatment plan with the patient. The patient was provided an opportunity to ask questions and all were answered. The patient agreed with the plan and demonstrated an understanding of the instructions.  I, Obdulia Steier, PA-C have reviewed all documentation for this visit. The documentation on 12/17/2023  for the exam, diagnosis, procedures, and orders are all accurate and complete.  Jolynn Spencer, [facility], MMS [facility] [phone] (phone) [phone] (fax)  [facility]

## 2023-12-24 ENCOUNTER — Other Ambulatory Visit: Payer: Self-pay | Admitting: Cardiology

## 2024-01-22 ENCOUNTER — Other Ambulatory Visit: Payer: Self-pay | Admitting: Cardiology

## 2024-01-23 NOTE — Progress Notes (Unsigned)
 Cardiology Clinic Note   Date: 01/25/2024 ID: Bartolo Montanye, DOB [birth year], MRN [id number]  Primary Cardiologist:  Redell Cave, MD  Chief Complaint   Alejandro Rush is a 68 y.o. male who presents to the clinic today for routine follow up.   Patient Profile   Alejandro Rush is followed by Dr. Cave for the history outlined below.      Past medical history significant for: Nonobstructive CAD. Coronary CTA 11/27/2022: Coronary calcium  score 321 (67th percentile).  Mild proximal LAD stenosis, minimal RCA stenosis.  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 Ascending aortic dilatation. Echo 11/10/2022: EF 55 to 60%.  No RWMA.  Mild LVH.  Normal global strain.  Normal RV size/function.  No significant valvular abnormalities.  Mild dilatation of ascending aorta 40 mm. Carotid artery stenosis. Carotid ultrasound 04/08/2017: <50% stenosis bilateral ICA. Hypertension. Hyperlipidemia. Lipid panel 08/07/2023: LDL 74, HDL 49, TG 72, total 137. Prediabetes. CVA. Tobacco abuse.  In summary, patient was first evaluated by Dr. Cave on 11/03/2022 for aortic atherosclerosis seen on CT.  Patient reported history of CVA due to hypertension with SBP in the 200s in 2016.  He reported occasional chest pain not usually associated with exertion.  BP was elevated at the time of his visit.  Medication changes were deferred to follow-up.  Patient underwent echo which showed normal LV/RV function.  Coronary CTA showed nonobstructive CAD.  Patient was last seen in the office by Dr. Cave on 01/08/2023 to follow-up after testing.  His BP was well-controlled at the time of his visit.  Atorvastatin  was increased to 80 mg daily.     History of Present Illness    Today, patient is doing well. Patient denies shortness of breath, dyspnea on exertion, lower extremity edema, orthopnea or PND. No chest pain, pressure, or tightness. No palpitations.  He reports occasional positional lightheadedness  particularly if he is bent over with his head dropped down and then stands up quickly. No presyncope or syncope. He is active at home performing light to moderate household activities and yard work without limitation. He continues to smoke <1 pack of cigarettes a day.     ROS: All other systems reviewed and are otherwise negative except as noted in History of Present Illness.  EKGs/Labs Reviewed    EKG Interpretation Date/Time:  Monday January 25 2024 10:34:09 EDT Ventricular Rate:  72 PR Interval:  176 QRS Duration:  100 QT Interval:  410 QTC Calculation: 448 R Axis:   -22  Text Interpretation: Normal sinus rhythm When compared with ECG of 11/03/2022 (not in Muse) No significant changes Confirmed by Loistine Sober [phone]) on 01/25/2024 10:41:22 AM   08/07/2023: ALT 21; AST 24; BUN 27; Creatinine, Ser 1.17; Potassium 4.2; Sodium 140   02/26/2023: Hemoglobin 12.5; WBC 8.8   08/07/2023: TSH 1.760    Physical Exam    VS:  BP 112/72   Pulse 72   Ht 6' (1.829 m)   Wt 222 lb 9.6 oz (101 kg)   SpO2 97%   BMI 30.19 kg/m  , BMI Body mass index is 30.19 kg/m.  GEN: Well nourished, well developed, in no acute distress. Neck: No JVD or carotid bruits. Cardiac:  RRR.  No murmur. No rubs or gallops.   Respiratory:  Respirations regular and unlabored. Clear to auscultation without rales, wheezing or rhonchi. GI: Soft, nontender, nondistended. Extremities: Radials/DP/PT 2+ and equal bilaterally. No clubbing or cyanosis. No edema.  Skin: Warm and dry, no rash. Neuro: Strength intact.  Assessment &  Plan   Nonobstructive CAD Coronary CTA July 2024 demonstrated calcium  score of 321, mild proximal LAD stenosis, minimal RCA stenosis.  Patient denies chest pain, pressure or tightness. He is active at home without limitation.  - Continue aspirin , atorvastatin .  Ascending aortic dilatation Echo June 2024 demonstrated mild dilatation of ascending aorta 40 mm.  Patient denies chest pain,  back pain, presyncope or syncope.   Carotid artery stenosis Carotid ultrasound November 2018 demonstrated <50% stenosis bilateral ICA.  Patient reports occasional positional lightheadedness particularly if he bends over with his head down and gets up slowly. Educated on slow position changes.  - Continue aspirin , atorvastatin . - Schedule carotid duplex.  Hypertension BP today 112/72 No headaches.  - Continue amlodipine , Cardura, HCTZ, losartan .  Hyperlipidemia LDL 74 March 2025. - Continue atorvastatin .  Tobacco abuse Patient reports smoking <1 pack per day. He would like to quit and is slowly cutting back.  - Encouraged complete cessation.  Disposition: Carotid US . Return in 1 year or sooner as needed.          Signed, Alejandro HERO. Daralyn Bert, DNP, NP-C

## 2024-01-25 ENCOUNTER — Other Ambulatory Visit: Payer: Self-pay | Admitting: Physician Assistant

## 2024-01-25 ENCOUNTER — Ambulatory Visit: Attending: Student | Admitting: Student

## 2024-01-25 ENCOUNTER — Encounter: Payer: Self-pay | Admitting: Student

## 2024-01-25 VITALS — BP 112/72 | HR 72 | Ht 72.0 in | Wt 222.6 lb

## 2024-01-25 DIAGNOSIS — I77819 Aortic ectasia, unspecified site: Secondary | ICD-10-CM | POA: Diagnosis not present

## 2024-01-25 DIAGNOSIS — I10 Essential (primary) hypertension: Secondary | ICD-10-CM | POA: Diagnosis not present

## 2024-01-25 DIAGNOSIS — E785 Hyperlipidemia, unspecified: Secondary | ICD-10-CM

## 2024-01-25 DIAGNOSIS — Z720 Tobacco use: Secondary | ICD-10-CM

## 2024-01-25 DIAGNOSIS — I2510 Atherosclerotic heart disease of native coronary artery without angina pectoris: Secondary | ICD-10-CM

## 2024-01-25 DIAGNOSIS — I6523 Occlusion and stenosis of bilateral carotid arteries: Secondary | ICD-10-CM

## 2024-01-25 NOTE — Patient Instructions (Signed)
 Medication Instructions:  Your physician recommends that you continue on your current medications as directed. Please refer to the Current Medication list given to you today.   *If you need a refill on your cardiac medications before your next appointment, please call your pharmacy*  Lab Work: None ordered at this time  If you have labs (blood work) drawn today and your tests are completely normal, you will receive your results only by: MyChart Message (if you have MyChart) OR A paper copy in the mail If you have any lab test that is abnormal or we need to change your treatment, we will call you to review the results.  Testing/Procedures: Your physician has requested that you have a Carotid Duplex. This test is an ultrasound of the carotid arteries in your neck. It looks at blood flow through these arteries that supply the brain with blood.   Allow one hour for this exam.  There are no restrictions or special instructions.  This will take place at [address] [facility] [address], [place] [address]  Please note: We ask at that you not bring children with you during ultrasound (echo/ vascular) testing. Due to room size and safety concerns, children are not allowed in the ultrasound rooms during exams. Our front office staff cannot provide observation of children in our lobby area while testing is being conducted. An adult accompanying a patient to their appointment will only be allowed in the ultrasound room at the discretion of the ultrasound technician under special circumstances. We apologize for any inconvenience.   Follow-Up: At [facility], you and your health needs are our priority.  As part of our continuing mission to provide you with exceptional heart care, our providers are all part of one team.  This team includes your primary Cardiologist (physician) and Advanced Practice Providers or APPs (Physician Assistants and Nurse Practitioners) who all work together  to provide you with the care you need, when you need it.  Your next appointment:   1 year(s)  Provider:   You may see Redell Cave, MD or one of the following Advanced Practice Providers on your designated Care Team:   Lonni Meager, NP Lesley Maffucci, PA-C Bernardino Bring, PA-C Cadence Washington Boro, PA-C Tylene Lunch, NP Barnie Hila, NP    We recommend signing up for the patient portal called MyChart.  Sign up information is provided on this After Visit Summary.  MyChart is used to connect with patients for Virtual Visits (Telemedicine).  Patients are able to view lab/test results, encounter notes, upcoming appointments, etc.  Non-urgent messages can be sent to your provider as well.   To learn more about what you can do with MyChart, go to [email or web address].

## 2024-01-31 ENCOUNTER — Other Ambulatory Visit: Payer: Self-pay | Admitting: Physician Assistant

## 2024-01-31 DIAGNOSIS — I10 Essential (primary) hypertension: Secondary | ICD-10-CM

## 2024-02-01 ENCOUNTER — Ambulatory Visit: Attending: Student

## 2024-02-01 DIAGNOSIS — I6523 Occlusion and stenosis of bilateral carotid arteries: Secondary | ICD-10-CM | POA: Diagnosis not present

## 2024-02-03 ENCOUNTER — Ambulatory Visit: Payer: Self-pay | Admitting: Student

## 2024-02-05 ENCOUNTER — Other Ambulatory Visit: Payer: Self-pay

## 2024-02-05 ENCOUNTER — Encounter: Payer: Self-pay | Admitting: Emergency Medicine

## 2024-02-05 ENCOUNTER — Inpatient Hospital Stay (HOSPITAL_COMMUNITY)
Admission: EM | Admit: 2024-02-05 | Discharge: 2024-02-07 | DRG: 177 | Disposition: A | Discharge status: Home / Self Care | Attending: Hospitalist | Admitting: Hospitalist

## 2024-02-05 ENCOUNTER — Emergency Department (HOSPITAL_COMMUNITY)

## 2024-02-05 DIAGNOSIS — Z6830 Body mass index (BMI) 30.0-30.9, adult: Secondary | ICD-10-CM

## 2024-02-05 DIAGNOSIS — Z79890 Hormone replacement therapy (postmenopausal): Secondary | ICD-10-CM

## 2024-02-05 DIAGNOSIS — Z833 Family history of diabetes mellitus: Secondary | ICD-10-CM

## 2024-02-05 DIAGNOSIS — Z7982 Long term (current) use of aspirin: Secondary | ICD-10-CM | POA: Diagnosis not present

## 2024-02-05 DIAGNOSIS — E669 Obesity, unspecified: Secondary | ICD-10-CM | POA: Diagnosis present

## 2024-02-05 DIAGNOSIS — Z79899 Other long term (current) drug therapy: Secondary | ICD-10-CM | POA: Diagnosis not present

## 2024-02-05 DIAGNOSIS — Z791 Long term (current) use of non-steroidal anti-inflammatories (NSAID): Secondary | ICD-10-CM

## 2024-02-05 DIAGNOSIS — E039 Hypothyroidism, unspecified: Secondary | ICD-10-CM | POA: Diagnosis present

## 2024-02-05 DIAGNOSIS — E66811 Obesity, class 1: Secondary | ICD-10-CM | POA: Diagnosis present

## 2024-02-05 DIAGNOSIS — I480 Paroxysmal atrial fibrillation: Secondary | ICD-10-CM | POA: Diagnosis not present

## 2024-02-05 DIAGNOSIS — N182 Chronic kidney disease, stage 2 (mild): Secondary | ICD-10-CM | POA: Diagnosis present

## 2024-02-05 DIAGNOSIS — I10 Essential (primary) hypertension: Secondary | ICD-10-CM | POA: Diagnosis present

## 2024-02-05 DIAGNOSIS — I129 Hypertensive chronic kidney disease with stage 1 through stage 4 chronic kidney disease, or unspecified chronic kidney disease: Secondary | ICD-10-CM | POA: Diagnosis not present

## 2024-02-05 DIAGNOSIS — J9601 Acute respiratory failure with hypoxia: Secondary | ICD-10-CM | POA: Diagnosis not present

## 2024-02-05 DIAGNOSIS — I69398 Other sequelae of cerebral infarction: Secondary | ICD-10-CM

## 2024-02-05 DIAGNOSIS — S270XXA Traumatic pneumothorax, initial encounter: Secondary | ICD-10-CM | POA: Diagnosis not present

## 2024-02-05 DIAGNOSIS — Z8249 Family history of ischemic heart disease and other diseases of the circulatory system: Secondary | ICD-10-CM | POA: Diagnosis not present

## 2024-02-05 DIAGNOSIS — F17210 Nicotine dependence, cigarettes, uncomplicated: Secondary | ICD-10-CM | POA: Diagnosis not present

## 2024-02-05 DIAGNOSIS — Y92009 Unspecified place in unspecified non-institutional (private) residence as the place of occurrence of the external cause: Secondary | ICD-10-CM

## 2024-02-05 DIAGNOSIS — S2231XA Fracture of one rib, right side, initial encounter for closed fracture: Secondary | ICD-10-CM | POA: Diagnosis not present

## 2024-02-05 DIAGNOSIS — J690 Pneumonitis due to inhalation of food and vomit: Principal | ICD-10-CM | POA: Diagnosis present

## 2024-02-05 DIAGNOSIS — I4891 Unspecified atrial fibrillation: Secondary | ICD-10-CM

## 2024-02-05 DIAGNOSIS — F1090 Alcohol use, unspecified, uncomplicated: Secondary | ICD-10-CM | POA: Diagnosis present

## 2024-02-05 DIAGNOSIS — E7849 Other hyperlipidemia: Secondary | ICD-10-CM | POA: Diagnosis not present

## 2024-02-05 DIAGNOSIS — Z8673 Personal history of transient ischemic attack (TIA), and cerebral infarction without residual deficits: Secondary | ICD-10-CM

## 2024-02-05 DIAGNOSIS — S2241XA Multiple fractures of ribs, right side, initial encounter for closed fracture: Secondary | ICD-10-CM | POA: Diagnosis not present

## 2024-02-05 DIAGNOSIS — Y901 Blood alcohol level of 20-39 mg/100 ml: Secondary | ICD-10-CM | POA: Diagnosis present

## 2024-02-05 DIAGNOSIS — M170 Bilateral primary osteoarthritis of knee: Secondary | ICD-10-CM | POA: Diagnosis present

## 2024-02-05 DIAGNOSIS — R079 Chest pain, unspecified: Secondary | ICD-10-CM | POA: Diagnosis not present

## 2024-02-05 DIAGNOSIS — J939 Pneumothorax, unspecified: Secondary | ICD-10-CM | POA: Diagnosis present

## 2024-02-05 DIAGNOSIS — W06XXXA Fall from bed, initial encounter: Secondary | ICD-10-CM | POA: Diagnosis present

## 2024-02-05 DIAGNOSIS — S2239XA Fracture of one rib, unspecified side, initial encounter for closed fracture: Secondary | ICD-10-CM | POA: Diagnosis present

## 2024-02-05 LAB — CBC
HCT: 35.2 % — ABNORMAL LOW (ref 39.0–52.0)
Hemoglobin: 11.7 g/dL — ABNORMAL LOW (ref 13.0–17.0)
MCH: 30.2 pg (ref 26.0–34.0)
MCHC: 33.2 g/dL (ref 30.0–36.0)
MCV: 91 fL (ref 80.0–100.0)
Platelets: 347 K/uL (ref 150–400)
RBC: 3.87 MIL/uL — ABNORMAL LOW (ref 4.22–5.81)
RDW: 12.2 % (ref 11.5–15.5)
WBC: 9.9 K/uL (ref 4.0–10.5)
nRBC: 0 % (ref 0.0–0.2)

## 2024-02-05 LAB — BASIC METABOLIC PANEL WITH GFR
Anion gap: 12 (ref 5–15)
BUN: 17 mg/dL (ref 8–23)
CO2: 18 mmol/L — ABNORMAL LOW (ref 22–32)
Calcium: 9.1 mg/dL (ref 8.9–10.3)
Chloride: 102 mmol/L (ref 98–111)
Creatinine, Ser: 1.33 mg/dL — ABNORMAL HIGH (ref 0.61–1.24)
GFR, Estimated: 58 mL/min — ABNORMAL LOW (ref >60)
Glucose, Bld: 162 mg/dL — ABNORMAL HIGH (ref 70–99)
Potassium: 3.8 mmol/L (ref 3.5–5.1)
Sodium: 132 mmol/L — ABNORMAL LOW (ref 135–145)

## 2024-02-05 LAB — TROPONIN I (HIGH SENSITIVITY): Troponin I (High Sensitivity): 3 ng/L (ref <18)

## 2024-02-05 MED ORDER — MORPHINE SULFATE (PF) 2 MG/ML IV SOLN
4.0000 mg | Freq: Once | INTRAVENOUS | Status: AC
  Administered 2024-02-05: 4 mg via INTRAVENOUS
  Filled 2024-02-05: qty 2

## 2024-02-05 NOTE — ED Notes (Signed)
 Patient transported to X-ray

## 2024-02-05 NOTE — Progress Notes (Signed)
 Letter Sent.

## 2024-02-05 NOTE — ED Triage Notes (Addendum)
 Patient from home BIB [facility] w/ complaints of R sided chest pain for about an hour now very suddenly, pain is worse with inspiration patient endorses feeling a pop when breathing. The pain radiates to the R arm.    324 of ASA given 50 mcg of fentanyl given PTA   110/66  HR 70  CBG 198 Spo2-96 % RA

## 2024-02-05 NOTE — ED Provider Triage Note (Signed)
 Emergency Medicine Provider Triage Evaluation Note  Alejandro Rush , a 68 y.o. male  was evaluated in triage.  Pt complains of chest pain. Acute onset of chest pain earlier today.   Review of Systems  Positive: Chest pain  Negative:   Physical Exam  BP 111/71   Pulse 79   Temp 98.2 F (36.8 C)   Resp 18   Ht 6' (1.829 m)   Wt 101 kg   SpO2 98%   BMI 30.20 kg/m  Gen:   Awake, uncomfortable Resp:  Normal effort  MSK:   Moves extremities without difficulty  Other:    Medical Decision Making  Medically screening exam initiated at 11:05 PM.  Appropriate orders placed.  Alejandro Rush was informed that the remainder of the evaluation will be completed by another provider, this initial triage assessment does not replace that evaluation, and the importance of remaining in the ED until their evaluation is complete.  Alejandro Rush is a 68 y.o. male here with chest pain. Will get labs to r/o ACS     Patt Alm Macho, MD 02/05/24 [phone]

## 2024-02-06 ENCOUNTER — Encounter (HOSPITAL_COMMUNITY): Payer: Self-pay

## 2024-02-06 ENCOUNTER — Emergency Department (HOSPITAL_COMMUNITY)

## 2024-02-06 ENCOUNTER — Other Ambulatory Visit: Payer: Self-pay

## 2024-02-06 DIAGNOSIS — W06XXXA Fall from bed, initial encounter: Secondary | ICD-10-CM | POA: Diagnosis present

## 2024-02-06 DIAGNOSIS — Z79899 Other long term (current) drug therapy: Secondary | ICD-10-CM | POA: Diagnosis not present

## 2024-02-06 DIAGNOSIS — F1090 Alcohol use, unspecified, uncomplicated: Secondary | ICD-10-CM | POA: Diagnosis present

## 2024-02-06 DIAGNOSIS — Z79890 Hormone replacement therapy (postmenopausal): Secondary | ICD-10-CM | POA: Diagnosis not present

## 2024-02-06 DIAGNOSIS — I69398 Other sequelae of cerebral infarction: Secondary | ICD-10-CM | POA: Diagnosis not present

## 2024-02-06 DIAGNOSIS — J90 Pleural effusion, not elsewhere classified: Secondary | ICD-10-CM | POA: Diagnosis not present

## 2024-02-06 DIAGNOSIS — Z833 Family history of diabetes mellitus: Secondary | ICD-10-CM | POA: Diagnosis not present

## 2024-02-06 DIAGNOSIS — R918 Other nonspecific abnormal finding of lung field: Secondary | ICD-10-CM | POA: Diagnosis not present

## 2024-02-06 DIAGNOSIS — E7849 Other hyperlipidemia: Secondary | ICD-10-CM | POA: Diagnosis present

## 2024-02-06 DIAGNOSIS — E669 Obesity, unspecified: Secondary | ICD-10-CM | POA: Diagnosis present

## 2024-02-06 DIAGNOSIS — F17210 Nicotine dependence, cigarettes, uncomplicated: Secondary | ICD-10-CM | POA: Diagnosis not present

## 2024-02-06 DIAGNOSIS — J690 Pneumonitis due to inhalation of food and vomit: Secondary | ICD-10-CM | POA: Diagnosis not present

## 2024-02-06 DIAGNOSIS — N182 Chronic kidney disease, stage 2 (mild): Secondary | ICD-10-CM | POA: Diagnosis not present

## 2024-02-06 DIAGNOSIS — I129 Hypertensive chronic kidney disease with stage 1 through stage 4 chronic kidney disease, or unspecified chronic kidney disease: Secondary | ICD-10-CM | POA: Diagnosis not present

## 2024-02-06 DIAGNOSIS — Z6830 Body mass index (BMI) 30.0-30.9, adult: Secondary | ICD-10-CM | POA: Diagnosis not present

## 2024-02-06 DIAGNOSIS — Y901 Blood alcohol level of 20-39 mg/100 ml: Secondary | ICD-10-CM | POA: Diagnosis present

## 2024-02-06 DIAGNOSIS — I480 Paroxysmal atrial fibrillation: Secondary | ICD-10-CM | POA: Diagnosis not present

## 2024-02-06 DIAGNOSIS — M898X8 Other specified disorders of bone, other site: Secondary | ICD-10-CM | POA: Diagnosis not present

## 2024-02-06 DIAGNOSIS — Z8249 Family history of ischemic heart disease and other diseases of the circulatory system: Secondary | ICD-10-CM | POA: Diagnosis not present

## 2024-02-06 DIAGNOSIS — M170 Bilateral primary osteoarthritis of knee: Secondary | ICD-10-CM | POA: Diagnosis present

## 2024-02-06 DIAGNOSIS — Z7982 Long term (current) use of aspirin: Secondary | ICD-10-CM | POA: Diagnosis not present

## 2024-02-06 DIAGNOSIS — Z791 Long term (current) use of non-steroidal anti-inflammatories (NSAID): Secondary | ICD-10-CM | POA: Diagnosis not present

## 2024-02-06 DIAGNOSIS — J9601 Acute respiratory failure with hypoxia: Secondary | ICD-10-CM | POA: Diagnosis not present

## 2024-02-06 DIAGNOSIS — I672 Cerebral atherosclerosis: Secondary | ICD-10-CM | POA: Diagnosis not present

## 2024-02-06 DIAGNOSIS — Y92009 Unspecified place in unspecified non-institutional (private) residence as the place of occurrence of the external cause: Secondary | ICD-10-CM | POA: Diagnosis not present

## 2024-02-06 DIAGNOSIS — M47812 Spondylosis without myelopathy or radiculopathy, cervical region: Secondary | ICD-10-CM | POA: Diagnosis not present

## 2024-02-06 DIAGNOSIS — S270XXA Traumatic pneumothorax, initial encounter: Secondary | ICD-10-CM | POA: Diagnosis not present

## 2024-02-06 DIAGNOSIS — E039 Hypothyroidism, unspecified: Secondary | ICD-10-CM | POA: Diagnosis not present

## 2024-02-06 DIAGNOSIS — S199XXA Unspecified injury of neck, initial encounter: Secondary | ICD-10-CM | POA: Diagnosis not present

## 2024-02-06 DIAGNOSIS — S0990XA Unspecified injury of head, initial encounter: Secondary | ICD-10-CM | POA: Diagnosis not present

## 2024-02-06 DIAGNOSIS — J939 Pneumothorax, unspecified: Secondary | ICD-10-CM | POA: Diagnosis not present

## 2024-02-06 DIAGNOSIS — S2231XA Fracture of one rib, right side, initial encounter for closed fracture: Secondary | ICD-10-CM | POA: Diagnosis not present

## 2024-02-06 LAB — TROPONIN I (HIGH SENSITIVITY): Troponin I (High Sensitivity): 3 ng/L (ref <18)

## 2024-02-06 LAB — CBC WITH DIFFERENTIAL/PLATELET
Abs Immature Granulocytes: 0.21 K/uL — ABNORMAL HIGH (ref 0.00–0.07)
Basophils Absolute: 0 K/uL (ref 0.0–0.1)
Basophils Relative: 0 %
Eosinophils Absolute: 0 K/uL (ref 0.0–0.5)
Eosinophils Relative: 0 %
HCT: 33.4 % — ABNORMAL LOW (ref 39.0–52.0)
Hemoglobin: 11 g/dL — ABNORMAL LOW (ref 13.0–17.0)
Immature Granulocytes: 1 %
Lymphocytes Relative: 6 %
Lymphs Abs: 1.3 K/uL (ref 0.7–4.0)
MCH: 29.9 pg (ref 26.0–34.0)
MCHC: 32.9 g/dL (ref 30.0–36.0)
MCV: 90.8 fL (ref 80.0–100.0)
Monocytes Absolute: 0.4 K/uL (ref 0.1–1.0)
Monocytes Relative: 2 %
Neutro Abs: 20 K/uL — ABNORMAL HIGH (ref 1.7–7.7)
Neutrophils Relative %: 91 %
Platelets: 361 K/uL (ref 150–400)
RBC: 3.68 MIL/uL — ABNORMAL LOW (ref 4.22–5.81)
RDW: 12.3 % (ref 11.5–15.5)
WBC: 22 K/uL — ABNORMAL HIGH (ref 4.0–10.5)
nRBC: 0 % (ref 0.0–0.2)

## 2024-02-06 LAB — PROTEIN / CREATININE RATIO, URINE
Creatinine, Urine: 39 mg/dL
Total Protein, Urine: <6 mg/dL

## 2024-02-06 LAB — COMPREHENSIVE METABOLIC PANEL WITH GFR
ALT: 21 U/L (ref 0–44)
AST: 24 U/L (ref 15–41)
Albumin: 3.8 g/dL (ref 3.5–5.0)
Alkaline Phosphatase: 50 U/L (ref 38–126)
Anion gap: 14 (ref 5–15)
BUN: 21 mg/dL (ref 8–23)
CO2: 16 mmol/L — ABNORMAL LOW (ref 22–32)
Calcium: 8.9 mg/dL (ref 8.9–10.3)
Chloride: 104 mmol/L (ref 98–111)
Creatinine, Ser: 1.35 mg/dL — ABNORMAL HIGH (ref 0.61–1.24)
GFR, Estimated: 56 mL/min — ABNORMAL LOW (ref >60)
Glucose, Bld: 131 mg/dL — ABNORMAL HIGH (ref 70–99)
Potassium: 3.9 mmol/L (ref 3.5–5.1)
Sodium: 134 mmol/L — ABNORMAL LOW (ref 135–145)
Total Bilirubin: 0.5 mg/dL (ref 0.0–1.2)
Total Protein: 6.6 g/dL (ref 6.5–8.1)

## 2024-02-06 LAB — PROCALCITONIN: Procalcitonin: <0.1 ng/mL

## 2024-02-06 LAB — ETHANOL: Alcohol, Ethyl (B): 20 mg/dL — ABNORMAL HIGH (ref <15)

## 2024-02-06 LAB — MAGNESIUM: Magnesium: 1.8 mg/dL (ref 1.7–2.4)

## 2024-02-06 MED ORDER — LOSARTAN POTASSIUM 50 MG PO TABS
100.0000 mg | ORAL_TABLET | Freq: Every day | ORAL | Status: DC
Start: 2024-02-06 — End: 2024-02-06

## 2024-02-06 MED ORDER — AMLODIPINE BESYLATE 10 MG PO TABS
10.0000 mg | ORAL_TABLET | Freq: Every day | ORAL | Status: DC
Start: 2024-02-06 — End: 2024-02-06

## 2024-02-06 MED ORDER — ACETAMINOPHEN 325 MG PO TABS
650.0000 mg | ORAL_TABLET | Freq: Four times a day (QID) | ORAL | Status: DC | PRN
  Administered 2024-02-06 – 2024-02-07 (×2): 650 mg via ORAL
  Filled 2024-02-06 (×3): qty 2

## 2024-02-06 MED ORDER — SODIUM CHLORIDE 0.9% FLUSH
3.0000 mL | Freq: Two times a day (BID) | INTRAVENOUS | Status: DC
  Administered 2024-02-06 (×2): 3 mL via INTRAVENOUS

## 2024-02-06 MED ORDER — LEVOTHYROXINE SODIUM 50 MCG PO TABS
50.0000 ug | ORAL_TABLET | Freq: Every day | ORAL | Status: DC
Start: 2024-02-06 — End: 2024-02-07
  Administered 2024-02-06 – 2024-02-07 (×2): 50 ug via ORAL
  Filled 2024-02-06 (×2): qty 1

## 2024-02-06 MED ORDER — VITAMIN B-12 1000 MCG PO TABS
1000.0000 ug | ORAL_TABLET | Freq: Every day | ORAL | Status: DC
  Administered 2024-02-06 – 2024-02-07 (×2): 1000 ug via ORAL
  Filled 2024-02-06 (×2): qty 1

## 2024-02-06 MED ORDER — ASPIRIN 81 MG PO TBEC
81.0000 mg | DELAYED_RELEASE_TABLET | Freq: Every day | ORAL | Status: DC
  Administered 2024-02-06: 81 mg via ORAL
  Filled 2024-02-06: qty 1

## 2024-02-06 MED ORDER — PREGABALIN 100 MG PO CAPS
100.0000 mg | ORAL_CAPSULE | Freq: Every day | ORAL | Status: DC
  Administered 2024-02-06: 100 mg via ORAL
  Filled 2024-02-06: qty 1

## 2024-02-06 MED ORDER — ENOXAPARIN SODIUM 40 MG/0.4ML IJ SOSY
40.0000 mg | PREFILLED_SYRINGE | INTRAMUSCULAR | Status: DC
  Administered 2024-02-06 – 2024-02-07 (×2): 40 mg via SUBCUTANEOUS
  Filled 2024-02-06 (×2): qty 0.4

## 2024-02-06 MED ORDER — ACETAMINOPHEN 650 MG RE SUPP: 650.0000 mg | Freq: Four times a day (QID) | RECTAL | Status: DC | PRN

## 2024-02-06 MED ORDER — MELATONIN 3 MG PO TABS
3.0000 mg | ORAL_TABLET | Freq: Every evening | ORAL | Status: DC | PRN
  Administered 2024-02-06: 3 mg via ORAL
  Filled 2024-02-06: qty 1

## 2024-02-06 MED ORDER — SODIUM CHLORIDE 0.9 % IV SOLN
3.0000 g | Freq: Four times a day (QID) | INTRAVENOUS | Status: DC
  Administered 2024-02-06 – 2024-02-07 (×5): 3 g via INTRAVENOUS
  Filled 2024-02-06 (×5): qty 8

## 2024-02-06 MED ORDER — ONDANSETRON HCL 4 MG/2ML IJ SOLN: 4.0000 mg | Freq: Four times a day (QID) | INTRAMUSCULAR | Status: DC | PRN

## 2024-02-06 MED ORDER — OXYCODONE-ACETAMINOPHEN 5-325 MG PO TABS: 1.0000 | ORAL_TABLET | Freq: Four times a day (QID) | ORAL | Status: DC | PRN

## 2024-02-06 MED ORDER — SODIUM CHLORIDE 0.9 % IV BOLUS
500.0000 mL | Freq: Once | INTRAVENOUS | Status: AC
  Administered 2024-02-06: 500 mL via INTRAVENOUS

## 2024-02-06 MED ORDER — IOHEXOL 350 MG/ML SOLN
75.0000 mL | Freq: Once | INTRAVENOUS | Status: AC | PRN
  Administered 2024-02-06: 75 mL via INTRAVENOUS

## 2024-02-06 MED ORDER — NAPROXEN 250 MG PO TABS
250.0000 mg | ORAL_TABLET | Freq: Three times a day (TID) | ORAL | Status: DC
  Administered 2024-02-06 – 2024-02-07 (×3): 250 mg via ORAL
  Filled 2024-02-06 (×5): qty 1

## 2024-02-06 MED ORDER — LIDOCAINE 5 % EX PTCH
2.0000 | MEDICATED_PATCH | CUTANEOUS | Status: DC
  Administered 2024-02-06 – 2024-02-07 (×2): 2 via TRANSDERMAL
  Filled 2024-02-06 (×2): qty 2

## 2024-02-06 MED ORDER — SODIUM CHLORIDE 0.9 % IV SOLN
3.0000 g | INTRAVENOUS | Status: AC
  Administered 2024-02-06: 3 g via INTRAVENOUS
  Filled 2024-02-06: qty 8

## 2024-02-06 MED ORDER — FERROUS SULFATE 325 (65 FE) MG PO TABS
324.0000 mg | ORAL_TABLET | Freq: Every day | ORAL | Status: DC
Start: 2024-02-07 — End: 2024-02-07
  Filled 2024-02-06: qty 1

## 2024-02-06 MED ORDER — DOXAZOSIN MESYLATE 8 MG PO TABS
8.0000 mg | ORAL_TABLET | Freq: Every day | ORAL | Status: DC
Start: 2024-02-06 — End: 2024-02-06
  Filled 2024-02-06: qty 1

## 2024-02-06 NOTE — Progress Notes (Signed)
   02/06/24 0731  Vitals  Temp 97.7 F (36.5 C)  Temp Source Oral  BP 100/73  MAP (mmHg) 82  BP Location Right Arm  BP Method Automatic  Patient Position (if appropriate) Lying  Pulse Rate 79  Pulse Rate Source Monitor  ECG Heart Rate 80  Resp 15  Level of Consciousness  Level of Consciousness Alert  MEWS COLOR  MEWS Score Color Green  Oxygen Therapy  SpO2 97 %  O2 Device Nasal Cannula  O2 Flow Rate (L/min) 4 L/min  MEWS Score  MEWS Temp 0  MEWS Systolic 1  MEWS Pulse 0  MEWS RR 0  MEWS LOC 0  MEWS Score 1   Pt admitted to unit from ED with all personal belongings. Pt A&Ox4, MAEx4, pt is alert and verbally responsive. Skin assessed with second RN per hospital policy and no pressure injuries noted. Tele applied and CCMD called. Pt oriented to unit, room, and call light system. Pt in bed with alarm on, bed in lowest position, and call bell within reach. Floor mats placed. Pt oriented to unit, room, and call light system.

## 2024-02-06 NOTE — Progress Notes (Signed)
 Pharmacy Antibiotic Note  Alejandro Rush is a 68 y.o. male admitted on 02/05/2024 with pneumonia.  Pharmacy has been consulted for unasyn  dosing.  Consulted for unasyn  for asp PNA.  Scr 1.33>>Crcl 64 ml/min  Plan: Unasyn  3g IV q6 Rx signs off  Height: 6' (182.9 cm) Weight: 101 kg (222 lb 10.6 oz) IBW/kg (Calculated) : 77.6  Temp (24hrs), Avg:98.2 F (36.8 C), Min:98.2 F (36.8 C), Max:98.2 F (36.8 C)  Recent Labs  Lab 02/05/24 2201  WBC 9.9  CREATININE 1.33*    Estimated Creatinine Clearance: 63.6 mL/min (A) (by C-G formula based on SCr of 1.33 mg/dL (H)).    No Known Allergies  Antimicrobials this admission: 6/20 unasyn >>  Dose adjustments this admission:   Microbiology results: 9/20 blood>>  Sergio Batch, PharmD, BCIDP, AAHIVP, CPP Infectious Disease Pharmacist 02/06/2024 4:43 AM

## 2024-02-06 NOTE — Progress Notes (Signed)
 SATURATION QUALIFICATIONS: (This note is used to comply with regulatory documentation for home oxygen)  Patient Saturations on Room Air at Rest = 94%  Patient Saturations on Room Air while Ambulating = 92%  Patient Saturations on 2 Liters of oxygen while Ambulating = 96%  Please briefly explain why patient needs home oxygen: patient walked approximately 250 feet, with no distress or shortness of breath on room air.

## 2024-02-06 NOTE — ED Notes (Signed)
 Pt is guarding his right upper quadrant

## 2024-02-06 NOTE — Progress Notes (Signed)
  Carryover admission to the Day Admitter.  I discussed this case with the EDP, Dr. Palumbo.  Per these discussions:   This is a 68 year old male who is being admitted with suspected aspiration pneumonia as well as rib fractures and trace apical pneumothorax after experiencing a which he tripped resulting in a fall in which he struck the right portion of his chest on the coffee table.  Not associate with any loss of consciousness.  CT CT cervical spine showed no evidence of acute process.  Chest x-ray showed no evidence of acute process.   However, as the patient was complaining of some pleuritic right-sided chest discomfort, CTA chest with PE protocol was pursued and showed a trace apical pneumothorax, as well as suggestive of a aspiration pneumonia in addition to right-sided rib fracture.  He had no objective hypoxia, and was maintaining oxygen saturations in the high 90s to 100% on room air.  Nonrebreather was started in the ED to prevent full reexpansion of patient's lung.  Vital signs also notable for afebrile, heart rates in the 70s, systolic pressures in the low 899d to 1 teens, respiratory rate 18.  Started on Unasyn  for suspected aspiration pneumonia.  Blood cultures x 2 were collected in the ED.  I have placed an order for observation for further evaluation management of the above.  I have placed some additional preliminary admit orders via the adult multi-morbid admission order set. I have also ordered continuation of Unasyn  for suspected aspiration pneumonia and have added on a procalcitonin level.  Regarding his rib fracture, continue the Lidoderm  patch that was started in the ED, and added as needed Percocet.  Fall precautions added.  I also ordered morning labs in the form of CMP, CBC, magnesium level.    Eva Pore, DO Hospitalist

## 2024-02-06 NOTE — H&P (Signed)
 History and Physical    Patient: Alejandro Rush FMW:[id number] DOB: 04/19/1956 DOA: 02/05/2024 DOS: the patient was seen and examined on 02/06/2024 PCP: Ostwalt, Janna, PA-C  Patient coming from: Home  Chief Complaint:  Chief Complaint  Patient presents with   Chest Pain   HPI: Alejandro Rush is a 68 y.o. male with medical history significant of prior CVA with residual right side numbness, hypertension, dyslipidemia, hypothyroidism, obesity, ongoing tobacco use, and remote history of regular alcohol use.  Patient brought to the ED by EMS with complaints of right sided chest pain ongoing for 1 hour with abrupt change in pain that was worse with inspiration and feeling a pop with respiratory effort.  The pain was radiating to his right arm.  It is also notable that prior to onset of this pain patient had been attempting to put on his pants while seated on the side of the bed and fell.  In the ED he was afebrile and normotensive.  His initial O2 sat was 98% on room air.  His labs were unremarkable except for creatinine of 1.35. Troponin normal.  No leukocytosis.  CT head and cervical spine without any acute traumatic injury.  Single view chest x-ray unremarkable.  CT angio chest without PE but there was a nondisplaced right lateral sixth rib fracture and a trace right apical pneumothorax and findings concerning for possible aspiration pneumonitis.  Hospitalist service was asked to evaluate the patient for admission.  Review of Systems: Patient is currently denying significant rib pain.  He states he is now able to take in a deep breath without any or minimal pain.  He reports receiving bilateral knee injections yesterday and upon review of medical records in the past he has received Durolane which has not a steroid.  In regards to the issues from yesterday he states he fell and got right up without any loss of consciousness.   Past Medical History:  Diagnosis Date   Arthritis    History of stroke 2016    Hyperlipidemia    Hypertension    Hypothyroid    Previous back surgery    Tobacco abuse    Past Surgical History:  Procedure Laterality Date   BACK SURGERY     BACK SURGERY     60's   Social History:  reports that he has been smoking cigarettes. He has a 30 pack-year smoking history. He does not have any smokeless tobacco history on file. He reports current alcohol use of about 4.0 standard drinks of alcohol per week. He reports that he does not use drugs.  No Known Allergies  Family History  Problem Relation Age of Onset   Diabetes Mother    Heart attack Maternal Uncle    Heart attack Maternal Uncle     Prior to Admission medications   Medication Sig Start Date End Date Taking? Authorizing Provider  amLODipine  (NORVASC ) 10 MG tablet TAKE 1 TABLET BY MOUTH EVERY DAY Patient taking differently: Take 10 mg by mouth in the morning. 01/25/24  Yes Ostwalt, Janna, PA-C  aspirin  EC 81 MG EC tablet Take 1 tablet (81 mg total) by mouth daily. Patient taking differently: Take 81 mg by mouth at bedtime. 12/28/14  Yes Maree Hue, MD  atorvastatin  (LIPITOR) 80 MG tablet TAKE 1 TABLET BY MOUTH DAILY AT 6 PM. Patient taking differently: Take 80 mg by mouth at bedtime. 12/24/23  Yes Darliss Rogue, MD  Cholecalciferol (VITAMIN D3) 50 MCG (2000 UT) capsule Take 2,000 Units by mouth at  bedtime.   Yes [provider]  cyanocobalamin  1000 MCG tablet Take 1,000 mcg by mouth daily.   Yes [provider]  doxazosin  (CARDURA ) 8 MG tablet TAKE 1 TABLET BY MOUTH EVERY DAY 02/01/24  Yes Ostwalt, Janna, PA-C  ferrous sulfate  324 MG TBEC Take 324 mg by mouth daily with breakfast.   Yes [provider]  hydrochlorothiazide  (HYDRODIURIL ) 25 MG tablet TAKE 1 TABLET (25 MG TOTAL) BY MOUTH DAILY. Patient taking differently: Take 25 mg by mouth in the morning. 12/14/23  Yes Ostwalt, Janna, PA-C  levothyroxine  (SYNTHROID ) 50 MCG tablet TAKE 1 TABLET BY MOUTH EVERY DAY 10/26/23  Yes  Ostwalt, Janna, PA-C  losartan  (COZAAR ) 100 MG tablet TAKE 1 TABLET BY MOUTH EVERY DAY 11/06/23  Yes Ostwalt, Janna, PA-C  meloxicam (MOBIC) 15 MG tablet Take 15 mg by mouth daily.   Yes [provider]  potassium chloride  (KLOR-CON ) 10 MEQ tablet TAKE 1 TABLET BY MOUTH EVERY DAY Patient taking differently: Take 10 mEq by mouth at bedtime. 01/22/24  Yes Darliss Rogue, MD  pregabalin  (LYRICA ) 100 MG capsule Take 100 mg by mouth at bedtime.   Yes [provider]    Physical Exam: Vitals:   02/06/24 0400 02/06/24 0500 02/06/24 0515 02/06/24 0709  BP: 99/70 95/69 97/75  105/78  Pulse: 76 68 74 86  Resp: 20 16 17 16   Temp:    97.8 F (36.6 C)  TempSrc:    Oral  SpO2: 100% 100% 100% 100%  Weight:      Height:       Constitutional: NAD, calm, comfortable Respiratory: Coarse but clear to auscultation bilaterally, no wheezing, no crackles. Normal respiratory effort. No accessory muscle use.  4 L oxygen-minimally tender to palpation over right lateral upper chest Cardiovascular: Regular rate and rhythm, no murmurs / rubs / gallops. No extremity edema. 2+ pedal pulses.  Abdomen: no tenderness, no masses palpated. No hepatosplenomegaly. Bowel sounds positive.  Musculoskeletal: no clubbing / cyanosis. No joint deformity upper and lower extremities. Good ROM, no contractures. Normal muscle tone.  Skin: no rashes, lesions, ulcers. No induration Neurologic: CN 2-12 grossly intact. Sensation intact,  Strength 5/5 x all 4 extremities.  Psychiatric: Normal judgment and insight. Alert and oriented x 3. Normal mood.    Data Reviewed:  Follow-up labs this morning: Sodium 134, potassium 3.9, CO2 16, glucose 131, BUN 21, creatinine 1.35, LFTs are normal  Procalcitonin normal, troponin normal  WBC 22,000 with neutrophils 91% and absolute neutrophils 20%, hemoglobin 11, platelets 361,000  Ethyl alcohol level obtained on labs from this morning is 20-possibly was higher at  presentation  Blood cultures are pending  Assessment and Plan: Acute hypoxemic respiratory failure Early aspiration pneumonitis Patient without any infectious respiratory symptoms prior to fall CT suggestive of early pneumonitis Initially requiring 4 L oxygen-will obtain room air ambulatory sats Continue IV Unasyn  noting patient did have a left shift on labs  Right sided chest pain secondary to nondisplaced sixth rib fracture on right after fall at home Tiny apical pneumothorax  Obtain follow-up two-view chest x-ray in a.m. to ensure pneumothorax remains stable-it was not visible on initial chest x-ray and was only visible on CTA chest Continue both narcotic and nonnarcotic medications for pain Patient also on Lyrica  at home-I suspect this may be related to sequela from prior stroke  Regular alcohol use Patient reported to me that he only drinks 5 beers per week but alcohol level obtained on a.m. labs this morning was 20 which  is elevated.  I suspect was higher yesterday and alcohol could have contributed to patient's fall As a precaution we will check CIWA scores and if they become elevated can institute Ativan CIWA withdrawal protocol  Tobacco use States smokes 1 pack/day Counseled regarding cessation  Hypertension Current blood pressure well-controlled and actually somewhat suboptimal Will hold home Cardura , Cozaar  and Norvasc  Was also on thiazide diuretic and this will be held Patient may be chronically rated if he is drinking more than he admits to and this also could have contributed to his fall Echocardiogram in 2024 demonstrated preserved LV function and mild LVH  CKD stage II In 2016 creatinine was 1.22 with normal GFR In June 2024 creatinine was 1.28 In March 2025 creatinine was 1.17 At presentation creatinine 1.33 with GFR 58 Obtain urine creatinine ratio in the event patient is developing hypertensive related renal dysfunction  Bilateral knee  osteoarthritis Receives regular injections to the knees apparently with Durolane which is a nonsteroidal medication Knees today without evidence of erythema or swelling and patient reports last injection was on 9/19  Dyslipidemia Continue statin  History of CVA with residual right side numbness Given fall and concerns over possible alcohol mediated gait disturbance will have PT evaluate patient    Advance Care Planning:   Code Status: Full Code   VTE prophylaxis: Lovenox   Consults: None  Family Communication: Patient  Severity of Illness: The appropriate patient status for this patient is OBSERVATION. Observation status is judged to be reasonable and necessary in order to provide the required intensity of service to ensure the patient's safety. The patient's presenting symptoms, physical exam findings, and initial radiographic and laboratory data in the context of their medical condition is felt to place them at decreased risk for further clinical deterioration. Furthermore, it is anticipated that the patient will be medically stable for discharge from the hospital within 2 midnights of admission.   Author: Isaiah Lever, NP 02/06/2024 7:24 AM  For on call review [email or web address].

## 2024-02-06 NOTE — ED Provider Notes (Signed)
 [place] [facility]ERGENC[facility] DEPARTMENT AT [facility] ([facility]) Provider Note   CSN: [id number] Arrival date & time: 02/05/24  2143     Patient presents with: Chest Pain   Alejandro Rush is a 68 y.o. male.   The history is provided by the patient.  Chest Pain Pain location:  R chest and R lateral chest Pain radiates to:  Does not radiate Pain severity:  Severe Onset quality:  Sudden Timing:  Constant Progression:  Unchanged Chronicity:  New Context: not eating   Context comment:  Fall over coffee table Relieved by:  Nothing Worsened by:  Nothing Ineffective treatments:  None tried Associated symptoms: no diaphoresis, no fever, no heartburn, no lower extremity edema, no syncope, no vomiting and no weakness   Risk factors: high cholesterol, hypertension and male sex   Patient with PMH of HTN and CVA presents with right chest pain post fall.  No fever, no chills.      Past Medical History:  Diagnosis Date   Arthritis    History of stroke 2016   Hyperlipidemia    Hypertension    Hypothyroid    Previous back surgery    Tobacco abuse      Prior to Admission medications   Medication Sig Start Date End Date Taking? Authorizing Provider  amLODipine  (NORVASC ) 10 MG tablet TAKE 1 TABLET BY MOUTH EVERY DAY 01/25/24   Ostwalt, Janna, PA-C  aspirin  EC 81 MG EC tablet Take 1 tablet (81 mg total) by mouth daily. 12/28/14   Maree Hue, MD  atorvastatin  (LIPITOR) 80 MG tablet TAKE 1 TABLET BY MOUTH DAILY AT 6 PM. 12/24/23   Darliss Rogue, MD  Cholecalciferol (VITAMIN D3) 25 MCG (1000 UT) CAPS Take by mouth.    [provider]  Cyanocobalamin  (B-12 PO) Take by mouth.    [provider]  doxazosin  (CARDURA ) 8 MG tablet TAKE 1 TABLET BY MOUTH EVERY DAY 02/01/24   Ostwalt, Janna, PA-C  ferrous sulfate  324 MG TBEC Take 324 mg by mouth.    [provider]  hydrochlorothiazide  (HYDRODIURIL ) 25 MG tablet TAKE 1 TABLET (25 MG TOTAL) BY MOUTH DAILY. 12/14/23   Ostwalt,  Janna, PA-C  levothyroxine  (SYNTHROID ) 50 MCG tablet TAKE 1 TABLET BY MOUTH EVERY DAY 10/26/23   Ostwalt, Janna, PA-C  losartan  (COZAAR ) 100 MG tablet TAKE 1 TABLET BY MOUTH EVERY DAY 11/06/23   Ostwalt, Janna, PA-C  meloxicam (MOBIC) 15 MG tablet Take 15 mg by mouth daily.    [provider]  Multiple Vitamin (MULTIVITAMIN) tablet Take 1 tablet by mouth daily.    [provider]  potassium chloride  (KLOR-CON ) 10 MEQ tablet TAKE 1 TABLET BY MOUTH EVERY DAY 01/22/24   Darliss Rogue, MD  pregabalin  (LYRICA ) 100 MG capsule Take 100 mg by mouth at bedtime.    [provider]    Allergies: Patient has no known allergies.    Review of Systems  Constitutional:  Negative for diaphoresis and fever.  Respiratory:  Negative for wheezing and stridor.   Cardiovascular:  Positive for chest pain. Negative for syncope.  Gastrointestinal:  Negative for heartburn and vomiting.  Neurological:  Negative for weakness.  All other systems reviewed and are negative.   Updated Vital Signs BP 111/71   Pulse 79   Temp 98.2 F (36.8 C)   Resp 18   Ht 6' (1.829 m)   Wt 101 kg   SpO2 98%   BMI 30.20 kg/m   Physical Exam Vitals and nursing note reviewed.  Constitutional:      General: He is not in acute distress.    Appearance: He is well-developed. He is not diaphoretic.  HENT:     Head: Normocephalic and atraumatic.     Nose: Nose normal.  Eyes:     Conjunctiva/sclera: Conjunctivae normal.     Pupils: Pupils are equal, round, and reactive to light.  Cardiovascular:     Rate and Rhythm: Normal rate and regular rhythm.     Pulses: Normal pulses.     Heart sounds: Normal heart sounds.  Pulmonary:     Effort: Pulmonary effort is normal.     Breath sounds: Normal breath sounds. No wheezing or rales.  Chest:    Abdominal:     General: Bowel sounds are normal.     Palpations: Abdomen is soft.     Tenderness: There is no abdominal tenderness. There is no guarding or  rebound.  Musculoskeletal:        General: Normal range of motion.     Cervical back: Normal range of motion and neck supple.  Skin:    General: Skin is warm and dry.     Capillary Refill: Capillary refill takes less than 2 seconds.  Neurological:     General: No focal deficit present.     Mental Status: He is alert and oriented to person, place, and time.     Deep Tendon Reflexes: Reflexes normal.  Psychiatric:        Mood and Affect: Mood normal.     (all labs ordered are listed, but only abnormal results are displayed) Results for orders placed or performed during the hospital encounter of 02/05/24  Basic metabolic panel   Collection Time: 02/05/24 10:01 PM  Result Value Ref Range   Sodium 132 (L) 135 - 145 mmol/L   Potassium 3.8 3.5 - 5.1 mmol/L   Chloride 102 98 - 111 mmol/L   CO2 18 (L) 22 - 32 mmol/L   Glucose, Bld 162 (H) 70 - 99 mg/dL   BUN 17 8 - 23 mg/dL   Creatinine, Ser 8.66 (H) 0.61 - 1.24 mg/dL   Calcium  9.1 8.9 - 10.3 mg/dL   GFR, Estimated 58 (L) >60 mL/min   Anion gap 12 5 - 15  CBC   Collection Time: 02/05/24 10:01 PM  Result Value Ref Range   WBC 9.9 4.0 - 10.5 K/uL   RBC 3.87 (L) 4.22 - 5.81 MIL/uL   Hemoglobin 11.7 (L) 13.0 - 17.0 g/dL   HCT 64.7 (L) 60.9 - 47.9 %   MCV 91.0 80.0 - 100.0 fL   MCH 30.2 26.0 - 34.0 pg   MCHC 33.2 30.0 - 36.0 g/dL   RDW 87.7 88.4 - 84.4 %   Platelets 347 150 - 400 K/uL   nRBC 0.0 0.0 - 0.2 %  Troponin I (High Sensitivity)   Collection Time: 02/05/24 10:01 PM  Result Value Ref Range   Troponin I (High Sensitivity) 3 <18 ng/L  Troponin I (High Sensitivity)   Collection Time: 02/06/24 12:24 AM  Result Value Ref Range   Troponin I (High Sensitivity) 3 <18 ng/L   CT Angio Chest PE W and/or Wo Contrast Result Date: 02/06/2024 EXAM: CT CHEST WITH AND WITHOUT CONTRAST 02/06/2024 02:31:38 AM TECHNIQUE: CT of the chest was performed with and without the administration of 75mL of intravenous iohexol  (OMNIPAQUE ) 350 MG/ML.  Multiplanar reformatted images are provided for review. Automated exposure control, iterative reconstruction, and/or weight based adjustment of the mA/kV was  utilized to reduce the radiation dose to as low as reasonably achievable. COMPARISON: None available. CLINICAL HISTORY: Syncope/presyncope, cerebrovascular cause suspected. Patient family reports that patient fell and hit his head with unknown loss of consciousness. Patient is not on thinners. Patient reports falling on the coffee table and hitting his right side. FINDINGS: MEDIASTINUM: Heart and pericardium are unremarkable. The central airways are clear. Mild coronary atherosclerosis of the LAD. LYMPH NODES: No mediastinal, hilar or axillary lymphadenopathy. LUNGS AND PLEURA: No evidence of pulmonary embolism. Possible isolated subsegmental filling defect in the distal right lower lobe pulmonary artery (image 101) is favored to be artifactual when correlating with additional series. Trace right pleural effusion. Mild scattered patchy/ground-glass opacities in the right upper lobe predominant (image 24), suggesting mild aspiration. Trace right apical pneumothorax (images 18 and 21), not radiographically apparent. SOFT TISSUES/BONES: Nondisplaced right lateral sixth rib fracture (image 74). UPPER ABDOMEN: Limited images of the upper abdomen demonstrates no acute abnormality. IMPRESSION: 1. No evidence of pulmonary embolism. 2. Nondisplaced right lateral sixth rib fracture. 3. Trace right apical pneumothorax, not radiographically apparent. 4. Suspected aspiration pneumonitis. 5. Trace right pleural effusion. Electronically signed by: Pinkie Pebbles MD 02/06/2024 02:44 AM EDT RP Workstation: [id number]   CT Cervical Spine Wo Contrast Result Date: 02/06/2024 EXAM: CT CERVICAL SPINE WITHOUT CONTRAST 02/06/2024 02:31:38 AM TECHNIQUE: CT of the cervical spine was performed without the administration of intravenous contrast. Multiplanar reformatted images are  provided for review. Automated exposure control, iterative reconstruction, and/or weight based adjustment of the mA/kV was utilized to reduce the radiation dose to as low as reasonably achievable. COMPARISON: None available. CLINICAL HISTORY: Polytrauma, blunt. Table formatting from the original note was not included. Pt family reports that pt fell and hit his head unknown loc. Pt is not on thinners. Pt reports falling on the coffee table and hitting his right side. FINDINGS: CERVICAL SPINE: BONES AND ALIGNMENT: No acute fracture or traumatic malalignment. 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 DEGENERATIVE CHANGES: Degenerative changes of the mid cervical spine, most prominent at C4-5. SOFT TISSUES: No prevertebral soft tissue swelling. IMPRESSION: 1. No traumatic of the cervical spine. Electronically signed by: Pinkie Pebbles MD 02/06/2024 02:38 AM EDT RP Workstation: [id number]   CT Head Wo Contrast Result Date: 02/06/2024 EXAM: CT HEAD WITHOUT CONTRAST 02/06/2024 02:31:38 AM TECHNIQUE: CT of the head was performed without the administration of intravenous contrast. Automated exposure control, iterative reconstruction, and/or weight based adjustment of the mA/kV was utilized to reduce the radiation dose to as low as reasonably achievable. COMPARISON: 12/26/2014 CLINICAL HISTORY: Polytrauma, blunt. Table formatting from the original note was not included. Pt family reports that pt fell and hit his head unknown loc. Pt is not on thinners. Pt reports falling on the coffee table and hitting his right side. FINDINGS: BRAIN AND VENTRICLES: No acute hemorrhage. No evidence of acute infarct. No hydrocephalus. No extra-axial collection. No mass effect or midline shift. Mild intracranial atherosclerosis. ORBITS: No acute abnormality. SINUSES: No acute abnormality. SOFT TISSUES AND SKULL: No acute soft tissue abnormality. No skull fracture. IMPRESSION: 1. No acute intracranial  abnormality. Electronically signed by: Pinkie Pebbles MD 02/06/2024 02:37 AM EDT RP Workstation: [id number]   DG Chest 2 View Result Date: 02/05/2024 EXAM: 2 VIEW(S) XRAY OF THE CHEST 02/05/2024 10:14:54 PM COMPARISON: CT chest dated 10/20/2022. CLINICAL HISTORY: Chest pain R side. FINDINGS: LUNGS AND PLEURA: No focal pulmonary opacity. No pulmonary edema. No pleural effusion. No pneumothorax. HEART AND MEDIASTINUM: No acute abnormality  of the cardiac and mediastinal silhouettes. BONES AND SOFT TISSUES: No acute osseous abnormality. IMPRESSION: 1. No acute abnormalities. Electronically signed by: Pinkie Pebbles MD 02/05/2024 10:23 PM EDT RP Workstation: [id number]   VAS US  CAROTID Result Date: 02/02/2024 Carotid Arterial Duplex Study Patient Name:  ALEXANDRA POSADAS  Date of Exam:   02/01/2024 Medical Rec #: [id number]      Accession #:    [id number] Date of Birth: 02-Jan-1956      Patient Gender: M Patient Age:   28 years Exam Location:  [place] Procedure:      VAS US  CAROTID Referring Phys: BARNIE HILA --------------------------------------------------------------------------------  Indications:       Carotid artery disease and patient reports dizziness when                    getting up too quickly, blurred vision and numbness in the                    right arm since having a stroke several years ago. He denies                    any other cerebrovascular symptoms. Risk Factors:      Hypertension, hyperlipidemia, current smoker, prior CVA. Comparison Study:  On 04/08/2017, a carotid duplex showed velocities of 77/28                    cm/s in the RICA and 74/31 cm/s in the LICA. Performing Technologist: Nanetta Shad RVT  Examination Guidelines: A complete evaluation includes B-mode imaging, spectral Doppler, color Doppler, and power Doppler as needed of all accessible portions of each vessel. Bilateral testing is considered an integral part of a complete examination. Limited examinations for  reoccurring indications may be performed as noted.  Right Carotid Findings: +----------+--------+--------+--------+----------------------+--------+           PSV cm/sEDV cm/sStenosisPlaque Description    Comments +----------+--------+--------+--------+----------------------+--------+ CCA Prox  183     19                                             +----------+--------+--------+--------+----------------------+--------+ CCA Distal66      21      <50%    heterogenous                   +----------+--------+--------+--------+----------------------+--------+ ICA Prox  58      18      1-39%   heterogenous                   +----------+--------+--------+--------+----------------------+--------+ ICA Distal68      24                                             +----------+--------+--------+--------+----------------------+--------+ ECA       60      15              heterogenous and focal         +----------+--------+--------+--------+----------------------+--------+ +----------+--------+-------+---------+-------------------+           PSV cm/sEDV cmsDescribe Arm Pressure (mmHG) +----------+--------+-------+---------+-------------------+ Subclavian171            Turbulent                    +----------+--------+-------+---------+-------------------+ +---------+--------+--+--------+--+---------+  VertebralPSV cm/s32EDV cm/s12Antegrade +---------+--------+--+--------+--+---------+  Left Carotid Findings: +----------+--------+--------+--------+----------------------+--------+           PSV cm/sEDV cm/sStenosisPlaque Description    Comments +----------+--------+--------+--------+----------------------+--------+ CCA Prox  101     28                                             +----------+--------+--------+--------+----------------------+--------+ CCA Mid   83      25      <50%    heterogenous                    +----------+--------+--------+--------+----------------------+--------+ CCA Distal69      20      <50%    heterogenous                   +----------+--------+--------+--------+----------------------+--------+ ICA Prox  43      16                                             +----------+--------+--------+--------+----------------------+--------+ ICA Mid   66      23              heterogenous and focal         +----------+--------+--------+--------+----------------------+--------+ ICA Distal67      30                                             +----------+--------+--------+--------+----------------------+--------+ ECA       60      12              heterogenous and focal         +----------+--------+--------+--------+----------------------+--------+ +----------+--------+--------+----------------+-------------------+           PSV cm/sEDV cm/sDescribe        Arm Pressure (mmHG) +----------+--------+--------+----------------+-------------------+ [id number]             Multiphasic, WNL                    +----------+--------+--------+----------------+-------------------+ +---------+--------+--+--------+--+---------+ VertebralPSV cm/s37EDV cm/s14Antegrade +---------+--------+--+--------+--+---------+   Summary: Right Carotid: Velocities in the right ICA are consistent with a 1-39% stenosis. Left Carotid: There is no evidence of stenosis in the left ICA. The extracranial               vessels were near-normal with only minimal wall thickening or               plaque. Vertebrals:  Bilateral vertebral arteries demonstrate antegrade flow. Subclavians: Right subclavian artery flow was disturbed. Normal flow              hemodynamics were seen in the left subclavian artery. *See table(s) above for measurements and observations.  Electronically signed by Dorn Ross MD on 02/02/2024 at 3:39:00 PM.    Final     EKG:  Date: 02/06/2024  Rate: 79  Rhythm: normal sinus  rhythm  QRS Axis: normal  Intervals: normal  ST/T Wave abnormalities: normal  Conduction Disutrbances: none  Narrative Interpretation: unremarkable     Radiology: CT Angio Chest PE W and/or Wo Contrast Result Date: 02/06/2024 EXAM: CT CHEST WITH AND WITHOUT CONTRAST 02/06/2024 02:31:38 AM  TECHNIQUE: CT of the chest was performed with and without the administration of 75mL of intravenous iohexol  (OMNIPAQUE ) 350 MG/ML. Multiplanar reformatted images are provided for review. Automated exposure control, iterative reconstruction, and/or weight based adjustment of the mA/kV was utilized to reduce the radiation dose to as low as reasonably achievable. COMPARISON: None available. CLINICAL HISTORY: Syncope/presyncope, cerebrovascular cause suspected. Patient family reports that patient fell and hit his head with unknown loss of consciousness. Patient is not on thinners. Patient reports falling on the coffee table and hitting his right side. FINDINGS: MEDIASTINUM: Heart and pericardium are unremarkable. The central airways are clear. Mild coronary atherosclerosis of the LAD. LYMPH NODES: No mediastinal, hilar or axillary lymphadenopathy. LUNGS AND PLEURA: No evidence of pulmonary embolism. Possible isolated subsegmental filling defect in the distal right lower lobe pulmonary artery (image 101) is favored to be artifactual when correlating with additional series. Trace right pleural effusion. Mild scattered patchy/ground-glass opacities in the right upper lobe predominant (image 24), suggesting mild aspiration. Trace right apical pneumothorax (images 18 and 21), not radiographically apparent. SOFT TISSUES/BONES: Nondisplaced right lateral sixth rib fracture (image 74). UPPER ABDOMEN: Limited images of the upper abdomen demonstrates no acute abnormality. IMPRESSION: 1. No evidence of pulmonary embolism. 2. Nondisplaced right lateral sixth rib fracture. 3. Trace right apical pneumothorax, not radiographically  apparent. 4. Suspected aspiration pneumonitis. 5. Trace right pleural effusion. Electronically signed by: Pinkie Pebbles MD 02/06/2024 02:44 AM EDT RP Workstation: [id number]   CT Cervical Spine Wo Contrast Result Date: 02/06/2024 EXAM: CT CERVICAL SPINE WITHOUT CONTRAST 02/06/2024 02:31:38 AM TECHNIQUE: CT of the cervical spine was performed without the administration of intravenous contrast. Multiplanar reformatted images are provided for review. Automated exposure control, iterative reconstruction, and/or weight based adjustment of the mA/kV was utilized to reduce the radiation dose to as low as reasonably achievable. COMPARISON: None available. CLINICAL HISTORY: Polytrauma, blunt. Table formatting from the original note was not included. Pt family reports that pt fell and hit his head unknown loc. Pt is not on thinners. Pt reports falling on the coffee table and hitting his right side. FINDINGS: CERVICAL SPINE: BONES AND ALIGNMENT: No acute fracture or traumatic malalignment. 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 DEGENERATIVE CHANGES: Degenerative changes of the mid cervical spine, most prominent at C4-5. SOFT TISSUES: No prevertebral soft tissue swelling. IMPRESSION: 1. No traumatic of the cervical spine. Electronically signed by: Pinkie Pebbles MD 02/06/2024 02:38 AM EDT RP Workstation: [id number]   CT Head Wo Contrast Result Date: 02/06/2024 EXAM: CT HEAD WITHOUT CONTRAST 02/06/2024 02:31:38 AM TECHNIQUE: CT of the head was performed without the administration of intravenous contrast. Automated exposure control, iterative reconstruction, and/or weight based adjustment of the mA/kV was utilized to reduce the radiation dose to as low as reasonably achievable. COMPARISON: 12/26/2014 CLINICAL HISTORY: Polytrauma, blunt. Table formatting from the original note was not included. Pt family reports that pt fell and hit his head unknown loc. Pt is not on thinners. Pt  reports falling on the coffee table and hitting his right side. FINDINGS: BRAIN AND VENTRICLES: No acute hemorrhage. No evidence of acute infarct. No hydrocephalus. No extra-axial collection. No mass effect or midline shift. Mild intracranial atherosclerosis. ORBITS: No acute abnormality. SINUSES: No acute abnormality. SOFT TISSUES AND SKULL: No acute soft tissue abnormality. No skull fracture. IMPRESSION: 1. No acute intracranial abnormality. Electronically signed by: Pinkie Pebbles MD 02/06/2024 02:37 AM EDT RP Workstation: [id number]   DG Chest 2 View Result Date: 02/05/2024 EXAM: 2 VIEW(S)  XRAY OF THE CHEST 02/05/2024 10:14:54 PM COMPARISON: CT chest dated 10/20/2022. CLINICAL HISTORY: Chest pain R side. FINDINGS: LUNGS AND PLEURA: No focal pulmonary opacity. No pulmonary edema. No pleural effusion. No pneumothorax. HEART AND MEDIASTINUM: No acute abnormality of the cardiac and mediastinal silhouettes. BONES AND SOFT TISSUES: No acute osseous abnormality. IMPRESSION: 1. No acute abnormalities. Electronically signed by: Pinkie Pebbles MD 02/05/2024 10:23 PM EDT RP Workstation: [id number]     .Critical Care  Performed by: Nettie Earing, MD Authorized by: Nettie Earing, MD   Critical care provider statement:    Critical care time (minutes):  30   Critical care end time:  02/06/2024 3:36 AM   Critical care was necessary to treat or prevent imminent or life-threatening deterioration of the following conditions:  Trauma   Critical care was time spent personally by me on the following activities:  Development of treatment plan with patient or surrogate, discussions with consultants, evaluation of patient's response to treatment, examination of patient, ordering and review of laboratory studies, ordering and review of radiographic studies, ordering and performing treatments and interventions, pulse oximetry, re-evaluation of patient's condition and review of old charts   I assumed direction of  critical care for this patient from another provider in my specialty: no     Care discussed with: admitting provider   Comments:     NRB for small apical PTX    Medications Ordered in the ED  lidocaine  (LIDODERM ) 5 % 2 patch (has no administration in time range)  sodium chloride  0.9 % bolus 500 mL (has no administration in time range)  Ampicillin -Sulbactam (UNASYN ) 3 g in sodium chloride  0.9 % 100 mL IVPB (has no administration in time range)  morphine  (PF) 2 MG/ML injection 4 mg (4 mg Intravenous Given 02/05/24 2352)  iohexol  (OMNIPAQUE ) 350 MG/ML injection 75 mL (75 mLs Intravenous Contrast Given 02/06/24 0232)                                    Medical Decision Making Patient with right chest pain post fall   Amount and/or Complexity of Data Reviewed Independent Historian: spouse    Details: Spouse and other family  External Data Reviewed: notes.    Details: Previous notes reviewed  Labs: ordered.    Details: Normal white count 9.9, low hemoglobin 11.7 normal platelets. 2 normal troponins 3/3. Sodium slight low 132, normal potassium 3.8, elevated creatinine 1.33  Radiology: ordered and independent interpretation performed.    Details: No PEs on CTA ECG/medicine tests: ordered and independent interpretation performed. Decision-making details documented in ED Course.  Risk Prescription drug management. Decision regarding hospitalization.     Final diagnoses:  None   The patient appears reasonably stabilized for admission considering the current resources, flow, and capabilities available in the ED at this time, and I doubt any other [facility] requiring further screening and/or treatment in the ED prior to admission.   ED Discharge Orders     None          Deannie Resetar, MD 02/06/24 [phone]

## 2024-02-06 NOTE — ED Notes (Signed)
 Pt family reports that pt fell and hit his head unknown loc. Pt is not on thinners. Pt reports falling on the coffee table and hitting his right side.

## 2024-02-07 ENCOUNTER — Encounter (HOSPITAL_COMMUNITY): Payer: Self-pay | Admitting: Internal Medicine

## 2024-02-07 ENCOUNTER — Other Ambulatory Visit: Payer: Self-pay

## 2024-02-07 ENCOUNTER — Inpatient Hospital Stay (HOSPITAL_COMMUNITY)

## 2024-02-07 DIAGNOSIS — Y92009 Unspecified place in unspecified non-institutional (private) residence as the place of occurrence of the external cause: Secondary | ICD-10-CM

## 2024-02-07 DIAGNOSIS — I480 Paroxysmal atrial fibrillation: Secondary | ICD-10-CM

## 2024-02-07 DIAGNOSIS — S2239XA Fracture of one rib, unspecified side, initial encounter for closed fracture: Secondary | ICD-10-CM | POA: Diagnosis present

## 2024-02-07 DIAGNOSIS — J939 Pneumothorax, unspecified: Secondary | ICD-10-CM | POA: Diagnosis present

## 2024-02-07 LAB — CBC WITH DIFFERENTIAL/PLATELET
Abs Immature Granulocytes: 0.39 K/uL — ABNORMAL HIGH (ref 0.00–0.07)
Basophils Absolute: 0 K/uL (ref 0.0–0.1)
Basophils Relative: 0 %
Eosinophils Absolute: 0 K/uL (ref 0.0–0.5)
Eosinophils Relative: 0 %
HCT: 36.1 % — ABNORMAL LOW (ref 39.0–52.0)
Hemoglobin: 12.1 g/dL — ABNORMAL LOW (ref 13.0–17.0)
Immature Granulocytes: 1 %
Lymphocytes Relative: 4 %
Lymphs Abs: 1.1 K/uL (ref 0.7–4.0)
MCH: 30.3 pg (ref 26.0–34.0)
MCHC: 33.5 g/dL (ref 30.0–36.0)
MCV: 90.5 fL (ref 80.0–100.0)
Monocytes Absolute: 1.4 K/uL — ABNORMAL HIGH (ref 0.1–1.0)
Monocytes Relative: 5 %
Neutro Abs: 24.4 K/uL — ABNORMAL HIGH (ref 1.7–7.7)
Neutrophils Relative %: 90 %
Platelets: 390 K/uL (ref 150–400)
RBC: 3.99 MIL/uL — ABNORMAL LOW (ref 4.22–5.81)
RDW: 12.8 % (ref 11.5–15.5)
Smear Review: NORMAL
WBC: 27.4 K/uL — ABNORMAL HIGH (ref 4.0–10.5)
nRBC: 0 % (ref 0.0–0.2)

## 2024-02-07 MED ORDER — TRAMADOL HCL 50 MG PO TABS: 50.0000 mg | ORAL_TABLET | Freq: Four times a day (QID) | ORAL | 0 refills | Status: DC | PRN

## 2024-02-07 MED ORDER — AMOXICILLIN-POT CLAVULANATE 875-125 MG PO TABS: 1.0000 | ORAL_TABLET | Freq: Two times a day (BID) | ORAL | 0 refills | Status: DC

## 2024-02-07 MED ORDER — APIXABAN 5 MG PO TABS: 5.0000 mg | ORAL_TABLET | Freq: Two times a day (BID) | ORAL | 0 refills | Status: DC

## 2024-02-07 MED ORDER — FERROUS SULFATE 325 (65 FE) MG PO TABS
325.0000 mg | ORAL_TABLET | Freq: Every day | ORAL | Status: DC
Start: 2024-02-07 — End: 2024-02-07
  Administered 2024-02-07: 325 mg via ORAL

## 2024-02-07 MED ORDER — LIDOCAINE 5 % EX PTCH: 2.0000 | MEDICATED_PATCH | CUTANEOUS | 0 refills | Status: DC

## 2024-02-07 NOTE — Evaluation (Signed)
 Physical Therapy Evaluation Patient Details Name: Alejandro Rush MRN: [id number] DOB: [birth year] Today's Date: 02/07/2024  History of Present Illness  68 yo male presents to [facility] 9/[facility] with R chest pain s/p fall, workup for aspiration PNA and R rib fx. PMH includes CVA with R residual weakness, HTN, HLD, hypothyroidism, obesity, tobacco abuse, ETOH use, bilat knee OA.  Clinical Impression  Pt presents with mild R chest wall discomfort, but otherwise is at baseline level of mobility, strength, balance. Pt ambulatory for good hallway distance without AD, pt maintained SPO2 93-95% on RA throughout with no DOE noted. Pt endorses feeling at baseline except for rib discomfort. PT encouraged pt to mobilize frequently at home within home environment to maintain strength and to promote pulmonary function. Pt expresses understanding, no further acute or post-acute PT needs at this time.       If plan is discharge home, recommend the following:     Can travel by private vehicle        Equipment Recommendations None recommended by PT  Recommendations for Other Services       Functional Status Assessment Patient has not had a recent decline in their functional status     Precautions / Restrictions Precautions Precautions: Fall Restrictions Weight Bearing Restrictions Per Provider Order: No      Mobility  Bed Mobility Overal bed mobility: Modified Independent             General bed mobility comments: use of rails, no physical assist    Transfers Overall transfer level: Modified independent Equipment used: None               General transfer comment: no assist    Ambulation/Gait Ambulation/Gait assistance: Modified independent (Device/Increase time) Gait Distance (Feet): 400 Feet Assistive device: None Gait Pattern/deviations: Step-through pattern, WFL(Within Functional Limits) Gait velocity: WFL for age     General Gait Details: initially pt mildly unsteady but all  pt-corrected, progressing to mod I with no UE support  Stairs            Wheelchair Mobility     Tilt Bed    Modified Rankin (Stroke Patients Only)       Balance Overall balance assessment: Mild deficits observed, not formally tested, History of Falls (pt states this was his first fall)                                           Pertinent Vitals/Pain Pain Assessment Pain Assessment: Faces Faces Pain Scale: Hurts a little bit Pain Location: R flank Pain Descriptors / Indicators: Discomfort, Sore Pain Intervention(s): Limited activity within patient's tolerance, Monitored during session, Repositioned    Home Living Family/patient expects to be discharged to:: Private residence Living Arrangements: Spouse/significant other Available Help at Discharge: Family Type of Home: House Home Access: Stairs to enter   Secretary/administrator of Steps: 1   Home Layout: One level Home Equipment: Cane - single point      Prior Function Prior Level of Function : Independent/Modified Independent                     Extremity/Trunk Assessment   Upper Extremity Assessment Upper Extremity Assessment: Defer to OT evaluation    Lower Extremity Assessment Lower Extremity Assessment: Overall WFL for tasks assessed (symmetric)    Cervical / Trunk Assessment Cervical / Trunk Assessment: Normal  Communication  Communication Communication: No apparent difficulties    Cognition Arousal: Alert Behavior During Therapy: WFL for tasks assessed/performed   PT - Cognitive impairments: No apparent impairments                                 Cueing       General Comments General comments (skin integrity, edema, etc.): SPO2 93% and greater on RA    Exercises     Assessment/Plan    PT Assessment Patient does not need any further PT services  PT Problem List         PT Treatment Interventions      PT Goals (Current goals can be  found in the Care Plan section)  Acute Rehab PT Goals Patient Stated Goal: go home today PT Goal Formulation: All assessment and education complete, DC therapy Time For Goal Achievement: 02/07/24 Potential to Achieve Goals: Good    Frequency       Co-evaluation               AM-PAC PT 6 Clicks Mobility  Outcome Measure Help needed turning from your back to your side while in a flat bed without using bedrails?: None Help needed moving from lying on your back to sitting on the side of a flat bed without using bedrails?: None Help needed moving to and from a bed to a chair (including a wheelchair)?: None Help needed standing up from a chair using your arms (e.g., wheelchair or bedside chair)?: None Help needed to walk in hospital room?: None Help needed climbing 3-5 steps with a railing? : None 6 Click Score: 24    End of Session   Activity Tolerance: Patient tolerated treatment well Patient left: in bed;with call bell/phone within reach Nurse Communication: Mobility status PT Visit Diagnosis: Other abnormalities of gait and mobility (R26.89)    Time: 9046-8994 PT Time Calculation (min) (ACUTE ONLY): 12 min   Charges:   PT Evaluation $PT Eval Low Complexity: 1 Low   PT General Charges $$ ACUTE PT VISIT: 1 Visit         Johana RAMAN, PT DPT Acute Rehabilitation Services Secure Chat Preferred  Office [phone]   Retha Bither E Johna 02/07/2024, 10:21 AM

## 2024-02-07 NOTE — Progress Notes (Signed)
 Patient's IV was taken out, catheter tip intact. Fair amount of bleeding controlled with extra gauze. Ice pack offered to patient but was declined. Patient with new onset afib determined this morning & verified with EKG. Dr. Mcarthur notified & he added Eliquis  & discharge instructions to follow up with PCP and cardiologist outpatient. This nurse added discharge info packets about afib, eliquis , and fall preventions at home. Reviewed discharge info/instructions and patient denied any questions. Patient wheeled down by [employer]. CN to be picked up by girlfriend.

## 2024-02-07 NOTE — Discharge Summary (Addendum)
 Physician Discharge Summary  Alejandro Rush FMW:[id number] DOB: 21-Jan-1956 DOA: 02/05/2024  PCP: Ostwalt, Janna, PA-C  Admit date: 02/05/2024 Discharge date: 02/07/2024  Admitted From: Home Disposition: Home  Recommendations for Outpatient Follow-up:  Follow up with PCP in 2-3 days , recommend CXR for apical pneumothorax and f/u CBC Follow up in ED if symptoms worsen or new appear Cardiology for follow-up in 2 to 4 weeks regarding atrial fibrillation.   Home Health: No Equipment/Devices: None  Discharge Condition: Stable CODE STATUS: Full Diet recommendation: Heart healthy  Brief/Interim Summary:  Alejandro Rush is a 68 y.o. male with medical history significant of prior CVA with residual right side numbness, hypertension, dyslipidemia, hypothyroidism, obesity, ongoing tobacco use, and remote history of regular alcohol use.  Patient brought to the ED by EMS with complaints of right sided chest pain ongoing for 1 hour with abrupt change in pain that was worse with inspiration and feeling a pop with respiratory effort.  The pain was radiating to his right arm.  It is also notable that prior to onset of this pain patient had been attempting to put on his pants while seated on the side of the bed and fell.  In the ED he was afebrile and normotensive.  His initial O2 sat was 98% on room air.  His labs were unremarkable except for creatinine of 1.35. Troponin normal.  No leukocytosis.  CT head and cervical spine without any acute traumatic injury.  Single view chest x-ray unremarkable.  CT angio chest without PE but there was a nondisplaced right lateral sixth rib fracture and a trace right apical pneumothorax and findings concerning for possible aspiration pneumonitis.  Patient was initiated on IV antibiotics and admitted under hospitalist.  Patient complained of some pain on the right side where he had rib fracture.  This was under control with Tylenol  as well as lidocaine  patch.  He was observed  overnight overnight and follow-up chest x-ray obtained the next morning revealed persistent trace apical pneumothorax.  Given his stability and lack of symptoms pertaining to pneumothorax he was discharged home.  He was also noted to have atrial fibrillation with controlled rate.  This is apparently new.  He is not on any anticoagulation.  He is on aspirin  given history of CVA in the past.  He is initiated on Eliquis  for stroke prophylaxis,  aspirin  was discontinued. He will need to follow-up with PCP in next couple days and obtain chest x-ray to ensure resolution of pneumothorax. Also he was noted to have leukocytosis 22,000 which actually worsened the next day.  Procalcitonin id however negative.  He is prescribed 5 days of Augmentin  to treat for aspiration pneumonia.  Patient's blood pressure was soft on admission.  He is on multiple antihypertensives including amlodipine , hydrochlorothiazide , doxazosin , losartan .  His blood pressure seems to be under control without these medications overnight.  He is advised to hold doxazosin  and losartan  at this time and follow-up with PCP for resumption.  Patient strongly recommended to quit smoking and drinking.  Patient discharged home in a stable condition.  Discharge Diagnoses:  Principal Problem:   Aspiration pneumonia (HCC) Active Problems:   Pneumothorax on right   Paroxysmal atrial fibrillation (HCC)   Essential hypertension   Other hyperlipidemia   Smoking greater than 40 pack years   Obesity (BMI 30.0-34.9)   History of stroke   Fall at home, initial encounter   Closed rib fracture    Discharge Instructions  Discharge Instructions     Ambulatory referral  to Cardiology   Complete by: As directed    New onset A fib   Diet - low sodium heart healthy   Complete by: As directed    Discharge instructions   Complete by: As directed    1.  Please return to the ER immediately if you experience sudden onset of chest pain and difficulty  catching breath/shortness of breath. 2.  Complete a course of antibiotics for pneumonia 3.  Please avoid strenous activities for the next 2 weeks.  Follow-up with PCP in next few days and get chest x-ray to ensure resolution of pneumothorax. 4.  You are strongly recommended to stop smoking 5.  Your blood pressure was soft despite not taking antihypertensives here in the hospital.  We recommend you hold doxazosin  and losartan  and follow-up with your primary care doctor within a week.  Please check your blood pressure at least twice a day and follow-up with your PCP. 6.  Start Eliquis  for anticoagulation due to new atrial fibrillation.  Stop taking Aspirin .  Referral placed for cardiology consultation.   Lifting restrictions   Complete by: As directed    Avoid heavy lifting as well as strenous activities for 2 weeks.      Allergies as of 02/07/2024   No Known Allergies      Medication List     PAUSE taking these medications    doxazosin  8 MG tablet Wait to take this until your doctor or other care provider tells you to start again. Commonly known as: CARDURA  TAKE 1 TABLET BY MOUTH EVERY DAY   losartan  100 MG tablet Wait to take this until your doctor or other care provider tells you to start again. Commonly known as: COZAAR  TAKE 1 TABLET BY MOUTH EVERY DAY       STOP taking these medications    aspirin  EC 81 MG tablet       TAKE these medications    amLODipine  10 MG tablet Commonly known as: NORVASC  TAKE 1 TABLET BY MOUTH EVERY DAY What changed: when to take this   amoxicillin -clavulanate 875-125 MG tablet Commonly known as: AUGMENTIN  Take 1 tablet by mouth 2 (two) times daily.   apixaban  5 MG Tabs tablet Commonly known as: ELIQUIS  Take 1 tablet (5 mg total) by mouth 2 (two) times daily.   atorvastatin  80 MG tablet Commonly known as: LIPITOR TAKE 1 TABLET BY MOUTH DAILY AT 6 PM. What changed: when to take this   cyanocobalamin  1000 MCG tablet Take 1,000 mcg  by mouth daily.   ferrous sulfate  324 MG Tbec Take 324 mg by mouth daily with breakfast.   hydrochlorothiazide  25 MG tablet Commonly known as: HYDRODIURIL  TAKE 1 TABLET (25 MG TOTAL) BY MOUTH DAILY. What changed: when to take this   levothyroxine  50 MCG tablet Commonly known as: SYNTHROID  TAKE 1 TABLET BY MOUTH EVERY DAY   lidocaine  5 % Commonly known as: LIDODERM  Place 2 patches onto the skin daily. Remove & Discard patch within 12 hours or as directed by MD Start taking on: February 08, 2024   meloxicam 15 MG tablet Commonly known as: MOBIC Take 15 mg by mouth daily.   potassium chloride  10 MEQ tablet Commonly known as: KLOR-CON  TAKE 1 TABLET BY MOUTH EVERY DAY What changed: when to take this   pregabalin  100 MG capsule Commonly known as: LYRICA  Take 100 mg by mouth at bedtime.   traMADol  50 MG tablet Commonly known as: Ultram  Take 1 tablet (50 mg total) by mouth every  6 (six) hours as needed.   Vitamin D3 50 MCG (2000 UT) capsule Take 2,000 Units by mouth at bedtime.        Follow-up Information     Ostwalt, Janna, PA-C .   Specialty: Physician Assistant Contact information: [address] [place] [address] [phone]         Ostwalt, Janna, PA-C. Schedule an appointment as soon as possible for a visit in 3 day(s).   Specialty: Physician Assistant Contact information: [address] [place] [address] [phone]                No Known Allergies  Consultations:    Procedures/Studies: DG Chest Portable 2 Views Result Date: 02/07/2024 CLINICAL DATA:  Aspiration pneumonia.  Rib fracture. EXAM: CHEST  2 VIEW PORTABLE COMPARISON:  Chest CTA 02/06/2024.  Chest x-ray FINDINGS: Low volume film. Cardiopericardial silhouette is at upper limits of normal for size. Left base atelectasis or infiltrate noted. Tiny right apical pneumothorax again noted. Bone island identified anterior left second rib. Patient's known right  sixth rib fracture not evident by x-ray. IMPRESSION: 1. Tiny right apical pneumothorax again noted. 2. Left base atelectasis or infiltrate. Electronically Signed   By: Camellia Candle M.D.   On: 02/07/2024 07:13   CT Angio Chest PE W and/or Wo Contrast Result Date: 02/06/2024 EXAM: CT CHEST WITH AND WITHOUT CONTRAST 02/06/2024 02:31:38 AM TECHNIQUE: CT of the chest was performed with and without the administration of 75mL of intravenous iohexol  (OMNIPAQUE ) 350 MG/ML. Multiplanar reformatted images are provided for review. Automated exposure control, iterative reconstruction, and/or weight based adjustment of the mA/kV was utilized to reduce the radiation dose to as low as reasonably achievable. COMPARISON: None available. CLINICAL HISTORY: Syncope/presyncope, cerebrovascular cause suspected. Patient family reports that patient fell and hit his head with unknown loss of consciousness. Patient is not on thinners. Patient reports falling on the coffee table and hitting his right side. FINDINGS: MEDIASTINUM: Heart and pericardium are unremarkable. The central airways are clear. Mild coronary atherosclerosis of the LAD. LYMPH NODES: No mediastinal, hilar or axillary lymphadenopathy. LUNGS AND PLEURA: No evidence of pulmonary embolism. Possible isolated subsegmental filling defect in the distal right lower lobe pulmonary artery (image 101) is favored to be artifactual when correlating with additional series. Trace right pleural effusion. Mild scattered patchy/ground-glass opacities in the right upper lobe predominant (image 24), suggesting mild aspiration. Trace right apical pneumothorax (images 18 and 21), not radiographically apparent. SOFT TISSUES/BONES: Nondisplaced right lateral sixth rib fracture (image 74). UPPER ABDOMEN: Limited images of the upper abdomen demonstrates no acute abnormality. IMPRESSION: 1. No evidence of pulmonary embolism. 2. Nondisplaced right lateral sixth rib fracture. 3. Trace right apical  pneumothorax, not radiographically apparent. 4. Suspected aspiration pneumonitis. 5. Trace right pleural effusion. Electronically signed by: Pinkie Pebbles MD 02/06/2024 02:44 AM EDT RP Workstation: [id number]   CT Cervical Spine Wo Contrast Result Date: 02/06/2024 EXAM: CT CERVICAL SPINE WITHOUT CONTRAST 02/06/2024 02:31:38 AM TECHNIQUE: CT of the cervical spine was performed without the administration of intravenous contrast. Multiplanar reformatted images are provided for review. Automated exposure control, iterative reconstruction, and/or weight based adjustment of the mA/kV was utilized to reduce the radiation dose to as low as reasonably achievable. COMPARISON: None available. CLINICAL HISTORY: Polytrauma, blunt. Table formatting from the original note was not included. Pt family reports that pt fell and hit his head unknown loc. Pt is not on thinners. Pt reports falling on the coffee table and hitting his right side.  FINDINGS: CERVICAL SPINE: BONES AND ALIGNMENT: No acute fracture or traumatic malalignment. 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 DEGENERATIVE CHANGES: Degenerative changes of the mid cervical spine, most prominent at C4-5. SOFT TISSUES: No prevertebral soft tissue swelling. IMPRESSION: 1. No traumatic of the cervical spine. Electronically signed by: Pinkie Pebbles MD 02/06/2024 02:38 AM EDT RP Workstation: [id number]   CT Head Wo Contrast Result Date: 02/06/2024 EXAM: CT HEAD WITHOUT CONTRAST 02/06/2024 02:31:38 AM TECHNIQUE: CT of the head was performed without the administration of intravenous contrast. Automated exposure control, iterative reconstruction, and/or weight based adjustment of the mA/kV was utilized to reduce the radiation dose to as low as reasonably achievable. COMPARISON: 12/26/2014 CLINICAL HISTORY: Polytrauma, blunt. Table formatting from the original note was not included. Pt family reports that pt fell and hit his head unknown  loc. Pt is not on thinners. Pt reports falling on the coffee table and hitting his right side. FINDINGS: BRAIN AND VENTRICLES: No acute hemorrhage. No evidence of acute infarct. No hydrocephalus. No extra-axial collection. No mass effect or midline shift. Mild intracranial atherosclerosis. ORBITS: No acute abnormality. SINUSES: No acute abnormality. SOFT TISSUES AND SKULL: No acute soft tissue abnormality. No skull fracture. IMPRESSION: 1. No acute intracranial abnormality. Electronically signed by: Pinkie Pebbles MD 02/06/2024 02:37 AM EDT RP Workstation: [id number]   DG Chest 2 View Result Date: 02/05/2024 EXAM: 2 VIEW(S) XRAY OF THE CHEST 02/05/2024 10:14:54 PM COMPARISON: CT chest dated 10/20/2022. CLINICAL HISTORY: Chest pain R side. FINDINGS: LUNGS AND PLEURA: No focal pulmonary opacity. No pulmonary edema. No pleural effusion. No pneumothorax. HEART AND MEDIASTINUM: No acute abnormality of the cardiac and mediastinal silhouettes. BONES AND SOFT TISSUES: No acute osseous abnormality. IMPRESSION: 1. No acute abnormalities. Electronically signed by: Pinkie Pebbles MD 02/05/2024 10:23 PM EDT RP Workstation: [id number]   VAS US  CAROTID Result Date: 02/02/2024 Carotid Arterial Duplex Study Patient Name:  Alejandro Rush  Date of Exam:   02/01/2024 Medical Rec #: [id number]      Accession #:    [id number] Date of Birth: 15-Apr-1956      Patient Gender: M Patient Age:   73 years Exam Location:  [facility] Procedure:      VAS US  CAROTID Referring Phys: BARNIE HILA --------------------------------------------------------------------------------  Indications:       Carotid artery disease and patient reports dizziness when                    getting up too quickly, blurred vision and numbness in the                    right arm since having a stroke several years ago. He denies                    any other cerebrovascular symptoms. Risk Factors:      Hypertension, hyperlipidemia, current smoker, prior CVA.  Comparison Study:  On 04/08/2017, a carotid duplex showed velocities of 77/28                    cm/s in the RICA and 74/31 cm/s in the LICA. Performing Technologist: Nanetta Shad RVT  Examination Guidelines: A complete evaluation includes B-mode imaging, spectral Doppler, color Doppler, and power Doppler as needed of all accessible portions of each vessel. Bilateral testing is considered an integral part of a complete examination. Limited examinations for reoccurring indications may be performed as noted.  Right Carotid Findings: +----------+--------+--------+--------+----------------------+--------+  PSV cm/sEDV cm/sStenosisPlaque Description    Comments +----------+--------+--------+--------+----------------------+--------+ CCA Prox  183     19                                             +----------+--------+--------+--------+----------------------+--------+ CCA Distal66      21      <50%    heterogenous                   +----------+--------+--------+--------+----------------------+--------+ ICA Prox  58      18      1-39%   heterogenous                   +----------+--------+--------+--------+----------------------+--------+ ICA Distal68      24                                             +----------+--------+--------+--------+----------------------+--------+ ECA       60      15              heterogenous and focal         +----------+--------+--------+--------+----------------------+--------+ +----------+--------+-------+---------+-------------------+           PSV cm/sEDV cmsDescribe Arm Pressure (mmHG) +----------+--------+-------+---------+-------------------+ Subclavian171            Turbulent                    +----------+--------+-------+---------+-------------------+ +---------+--------+--+--------+--+---------+ VertebralPSV cm/s32EDV cm/s12Antegrade +---------+--------+--+--------+--+---------+  Left Carotid Findings:  +----------+--------+--------+--------+----------------------+--------+           PSV cm/sEDV cm/sStenosisPlaque Description    Comments +----------+--------+--------+--------+----------------------+--------+ CCA Prox  101     28                                             +----------+--------+--------+--------+----------------------+--------+ CCA Mid   83      25      <50%    heterogenous                   +----------+--------+--------+--------+----------------------+--------+ CCA Distal69      20      <50%    heterogenous                   +----------+--------+--------+--------+----------------------+--------+ ICA Prox  43      16                                             +----------+--------+--------+--------+----------------------+--------+ ICA Mid   66      23              heterogenous and focal         +----------+--------+--------+--------+----------------------+--------+ ICA Distal67      30                                             +----------+--------+--------+--------+----------------------+--------+ ECA       60      12  heterogenous and focal         +----------+--------+--------+--------+----------------------+--------+ +----------+--------+--------+----------------+-------------------+           PSV cm/sEDV cm/sDescribe        Arm Pressure (mmHG) +----------+--------+--------+----------------+-------------------+ [id number]             Multiphasic, WNL                    +----------+--------+--------+----------------+-------------------+ +---------+--------+--+--------+--+---------+ VertebralPSV cm/s37EDV cm/s14Antegrade +---------+--------+--+--------+--+---------+   Summary: Right Carotid: Velocities in the right ICA are consistent with a 1-39% stenosis. Left Carotid: There is no evidence of stenosis in the left ICA. The extracranial               vessels were near-normal with only minimal wall thickening  or               plaque. Vertebrals:  Bilateral vertebral arteries demonstrate antegrade flow. Subclavians: Right subclavian artery flow was disturbed. Normal flow              hemodynamics were seen in the left subclavian artery. *See table(s) above for measurements and observations.  Electronically signed by Dorn Ross MD on 02/02/2024 at 3:39:00 PM.    Final       Subjective:   Discharge Exam: Vitals:   02/07/24 0300 02/07/24 0754  BP: 103/78 129/83  Pulse: 76 76  Resp: 13 12  Temp: 97.7 F (36.5 C) 97.8 F (36.6 C)  SpO2: 93% 94%    General: Pt is alert, awake, not in acute distress Cardiovascular: rate controlled, S1/S2 + Respiratory: bilateral decreased breath sounds at bases Abdominal: Soft, NT, ND, bowel sounds + Extremities: no edema, no cyanosis    The results of significant diagnostics from this hospitalization (including imaging, microbiology, ancillary and laboratory) are listed below for reference.     Microbiology: Recent Results (from the past 240 hours)  Blood culture (routine x 2)     Status: None (Preliminary result)   Collection Time: 02/06/24  3:05 AM   Specimen: BLOOD RIGHT FOREARM  Result Value Ref Range Status   Specimen Description BLOOD RIGHT FOREARM  Final   Special Requests   Final    BOTTLES DRAWN AEROBIC AND ANAEROBIC Blood Culture adequate volume   Culture   Final    NO GROWTH 1 DAY Performed at [facility], [address], [place], [place] [address]    Report Status PENDING  Incomplete  Blood culture (routine x 2)     Status: None (Preliminary result)   Collection Time: 02/06/24  3:08 AM   Specimen: BLOOD LEFT HAND  Result Value Ref Range Status   Specimen Description BLOOD LEFT HAND  Final   Special Requests   Final    BOTTLES DRAWN AEROBIC AND ANAEROBIC Blood Culture adequate volume   Culture   Final    NO GROWTH 1 DAY Performed at [facility], [address], [place], [place] [address]    Report Status  PENDING  Incomplete     Labs: BNP (last 3 results) No results for input(s): BNP in the last 8760 hours. Basic Metabolic Panel: Recent Labs  Lab 02/05/24 2201 02/06/24 0548  NA 132* 134*  K 3.8 3.9  CL 102 104  CO2 18* 16*  GLUCOSE 162* 131*  BUN 17 21  CREATININE 1.33* 1.35*  CALCIUM  9.1 8.9  MG  --  1.8   Liver Function Tests: Recent Labs  Lab 02/06/24 0548  AST 24  ALT 21  ALKPHOS 50  BILITOT 0.5  PROT 6.6  ALBUMIN 3.8   No results for input(s): LIPASE, AMYLASE in the last 168 hours. No results for input(s): AMMONIA in the last 168 hours. CBC: Recent Labs  Lab 02/05/24 2201 02/06/24 0548 02/07/24 1048  WBC 9.9 22.0* 27.4*  NEUTROABS  --  20.0* 24.4*  HGB 11.7* 11.0* 12.1*  HCT 35.2* 33.4* 36.1*  MCV 91.0 90.8 90.5  PLT 347 361 390   Cardiac Enzymes: No results for input(s): CKTOTAL, CKMB, CKMBINDEX, TROPONINI in the last 168 hours. BNP: Invalid input(s): POCBNP CBG: No results for input(s): GLUCAP in the last 168 hours. D-Dimer No results for input(s): DDIMER in the last 72 hours. Hgb A1c No results for input(s): HGBA1C in the last 72 hours. Lipid Profile No results for input(s): CHOL, HDL, LDLCALC, TRIG, CHOLHDL, LDLDIRECT in the last 72 hours. Thyroid  function studies No results for input(s): TSH, T4TOTAL, T3FREE, THYROIDAB in the last 72 hours.  Invalid input(s): FREET3 Anemia work up No results for input(s): VITAMINB12, FOLATE, FERRITIN, TIBC, IRON, RETICCTPCT in the last 72 hours. Urinalysis No results found for: COLORURINE, APPEARANCEUR, LABSPEC, PHURINE, GLUCOSEU, HGBUR, BILIRUBINUR, KETONESUR, PROTEINUR, UROBILINOGEN, NITRITE, LEUKOCYTESUR Sepsis Labs Recent Labs  Lab 02/05/24 2201 02/06/24 0548 02/07/24 1048  WBC 9.9 22.0* 27.4*   Microbiology Recent Results (from the past 240 hours)  Blood culture (routine x 2)     Status: None (Preliminary result)    Collection Time: 02/06/24  3:05 AM   Specimen: BLOOD RIGHT FOREARM  Result Value Ref Range Status   Specimen Description BLOOD RIGHT FOREARM  Final   Special Requests   Final    BOTTLES DRAWN AEROBIC AND ANAEROBIC Blood Culture adequate volume   Culture   Final    NO GROWTH 1 DAY Performed at [facility], [address], [place], [place] [address]    Report Status PENDING  Incomplete  Blood culture (routine x 2)     Status: None (Preliminary result)   Collection Time: 02/06/24  3:08 AM   Specimen: BLOOD LEFT HAND  Result Value Ref Range Status   Specimen Description BLOOD LEFT HAND  Final   Special Requests   Final    BOTTLES DRAWN AEROBIC AND ANAEROBIC Blood Culture adequate volume   Culture   Final    NO GROWTH 1 DAY Performed at [facility], [address] [place], [place] [address]    Report Status PENDING  Incomplete     Time coordinating discharge: 35 minutes  SIGNED:   Derryl Duval, MD  [facility] 02/07/2024, 3:01 PM

## 2024-02-08 ENCOUNTER — Ambulatory Visit: Payer: Self-pay | Admitting: Physician Assistant

## 2024-02-08 ENCOUNTER — Telehealth: Payer: Self-pay

## 2024-02-08 NOTE — Telephone Encounter (Signed)
 Transition Care Management Follow-up Telephone Call Date of discharge and from where: 02/07/24 [facility]  How have you been since you were released from the hospital? Better  Any questions or concerns? No  Items Reviewed: Did the pt receive and understand the discharge instructions provided? Yes  Medications obtained and verified? Yes  Other?  Any new allergies since your discharge? No  Dietary orders reviewed? Yes Do you have support at home? Yes   Home Care and Equipment/Supplies: Were home health services ordered? no If so, what is the name of the agency?   Has the agency set up a time to come to the patient's home? not applicable Were any new equipment or medical supplies ordered?  No What is the name of the medical supply agency? Not applicable  Were you able to get the supplies/equipment? not applicable Do you have any questions related to the use of the equipment or supplies? No  Functional Questionnaire: (I = Independent and D = Dependent) ADLs: I  Bathing/Dressing- I  Meal Prep- I  Eating- I  Maintaining continence- I  Transferring/Ambulation- I  Managing Meds- I  Follow up appointments reviewed:  PCP Hospital f/u appt confirmed? Yes  Scheduled to see Janna Ostwalt PA on 02/11/24 @ 9. Specialist Hospital f/u appt confirmed? No  Scheduled to see Cardiology Are transportation arrangements needed? No  If their condition worsens, is the pt aware to call PCP or go to the Emergency Dept.? Yes Was the patient provided with contact information for the PCP's office or ED? Yes Was to pt encouraged to call back with questions or concerns? Yes

## 2024-02-09 NOTE — Progress Notes (Signed)
 Let pt know that blood culture showed no growth

## 2024-02-10 NOTE — Progress Notes (Signed)
 Blood culture showed no growth/day 4

## 2024-02-11 ENCOUNTER — Ambulatory Visit
Admission: RE | Admit: 2024-02-11 | Discharge: 2024-02-11 | Disposition: A | Discharge status: Home / Self Care | Source: Ambulatory Visit | Attending: Physician Assistant | Admitting: Physician Assistant

## 2024-02-11 ENCOUNTER — Ambulatory Visit (INDEPENDENT_AMBULATORY_CARE_PROVIDER_SITE_OTHER): Admitting: Physician Assistant

## 2024-02-11 ENCOUNTER — Ambulatory Visit
Admission: RE | Admit: 2024-02-11 | Discharge: 2024-02-11 | Disposition: A | Discharge status: Home / Self Care | Attending: Physician Assistant | Admitting: Physician Assistant

## 2024-02-11 ENCOUNTER — Ambulatory Visit: Payer: Self-pay | Admitting: Physician Assistant

## 2024-02-11 ENCOUNTER — Encounter: Payer: Self-pay | Admitting: Physician Assistant

## 2024-02-11 VITALS — BP 123/87 | HR 82 | Resp 16 | Ht 72.0 in | Wt 215.8 lb

## 2024-02-11 DIAGNOSIS — E039 Hypothyroidism, unspecified: Secondary | ICD-10-CM

## 2024-02-11 DIAGNOSIS — F17200 Nicotine dependence, unspecified, uncomplicated: Secondary | ICD-10-CM | POA: Diagnosis not present

## 2024-02-11 DIAGNOSIS — D649 Anemia, unspecified: Secondary | ICD-10-CM | POA: Diagnosis not present

## 2024-02-11 DIAGNOSIS — J690 Pneumonitis due to inhalation of food and vomit: Secondary | ICD-10-CM

## 2024-02-11 DIAGNOSIS — Z09 Encounter for follow-up examination after completed treatment for conditions other than malignant neoplasm: Secondary | ICD-10-CM | POA: Insufficient documentation

## 2024-02-11 DIAGNOSIS — I480 Paroxysmal atrial fibrillation: Secondary | ICD-10-CM | POA: Diagnosis not present

## 2024-02-11 DIAGNOSIS — Z9189 Other specified personal risk factors, not elsewhere classified: Secondary | ICD-10-CM | POA: Diagnosis not present

## 2024-02-11 DIAGNOSIS — R0789 Other chest pain: Secondary | ICD-10-CM

## 2024-02-11 DIAGNOSIS — I10 Essential (primary) hypertension: Secondary | ICD-10-CM

## 2024-02-11 DIAGNOSIS — S2239XD Fracture of one rib, unspecified side, subsequent encounter for fracture with routine healing: Secondary | ICD-10-CM | POA: Diagnosis not present

## 2024-02-11 LAB — CULTURE, BLOOD (ROUTINE X 2)
Culture: NO GROWTH
Culture: NO GROWTH
Special Requests: ADEQUATE
Special Requests: ADEQUATE

## 2024-02-11 MED ORDER — LIDOCAINE 5 % EX PTCH: 1.0000 | MEDICATED_PATCH | CUTANEOUS | 1 refills | Status: DC

## 2024-02-11 NOTE — Progress Notes (Signed)
 Established patient visit  Patient: Alejandro Rush   DOB: December 11, 1955   68 y.o. Male  MRN: [id number] Visit Date: 02/11/2024  Today's healthcare provider: Jolynn Spencer, PA-C   Chief Complaint  Patient presents with   Hospitalization Follow-up    Clemens on coffee table and injured R side with rib fracture. Pt still complains of soreness medication has been helping a little   Subjective       Discussed the use of AI scribe software for clinical note transcription with the patient, who gave verbal consent to proceed.  History of Present Illness Alejandro Rush is a 68 year old male who presents for follow-up after recent hospitalization for pneumonia and new onset atrial fibrillation.  He was hospitalized from Friday/9.19.25 to Sunday/9.21.25 for pneumonia and cracked ribs. CT head and cervical spine showed no acute traumatic injury. CT angio chest showed no PE but a nondisplaced right lateral sixth rib fracture, a trace right apical pneumothorax, possible aspiration pneumonitis. He is currently on a blood thinner, amlodipine , and a new medication instead of aspirin . He also takes medications for anemia, thyroid  issues, and pain management. He uses pain relief patches but did not apply one that day.  He experienced a fall while changing clothes and taking a shower, resulting in cracked ribs. He consumed two beers on the day of the fall. He reports no current dizziness or vision problems. His blood pressure is stable with medication.  He uses spirometry to maintain lung function due to the rib fracture. He denies chest pain, shortness of breath, or palpitations at the time of the visit.  He has a history of alcohol use and is prescribed pregabalin  for nerve pain and tramadol  temporarily for pain management. He prefers using patches for pain relief. He is scheduled to see a cardiologist and had a cardiology appointment a week ago.       07/21/2023   11:07 AM 03/02/2023   10:43 AM  Depression  screen PHQ 2/9  Decreased Interest 0 0  Down, Depressed, Hopeless 0 0  PHQ - 2 Score 0 0  Altered sleeping 2   Tired, decreased energy 1   Change in appetite 0   Feeling bad or failure about yourself  0   Trouble concentrating 0   Moving slowly or fidgety/restless 0   Suicidal thoughts 0   PHQ-9 Score 3   Difficult doing work/chores Somewhat difficult       07/21/2023   11:07 AM  GAD 7 : Generalized Anxiety Score  Nervous, Anxious, on Edge 0  Control/stop worrying 0  Worry too much - different things 0  Trouble relaxing 0  Restless 0  Easily annoyed or irritable 0  Afraid - awful might happen 0  Total GAD 7 Score 0  Anxiety Difficulty Somewhat difficult    Medications: Outpatient Medications Prior to Visit  Medication Sig   amLODipine  (NORVASC ) 10 MG tablet TAKE 1 TABLET BY MOUTH EVERY DAY   amoxicillin -clavulanate (AUGMENTIN ) 875-125 MG tablet Take 1 tablet by mouth 2 (two) times daily.   apixaban  (ELIQUIS ) 5 MG TABS tablet Take 1 tablet (5 mg total) by mouth 2 (two) times daily.   atorvastatin  (LIPITOR) 80 MG tablet TAKE 1 TABLET BY MOUTH DAILY AT 6 PM.   Cholecalciferol (VITAMIN D3) 50 MCG (2000 UT) capsule Take 2,000 Units by mouth at bedtime.   cyanocobalamin  1000 MCG tablet Take 1,000 mcg by mouth daily.   [Paused] doxazosin  (CARDURA ) 8 MG tablet TAKE 1 TABLET BY MOUTH EVERY  DAY   ferrous sulfate  324 MG TBEC Take 324 mg by mouth daily with breakfast.   hydrochlorothiazide  (HYDRODIURIL ) 25 MG tablet TAKE 1 TABLET (25 MG TOTAL) BY MOUTH DAILY.   levothyroxine  (SYNTHROID ) 50 MCG tablet TAKE 1 TABLET BY MOUTH EVERY DAY   lidocaine  (LIDODERM ) 5 % Place 2 patches onto the skin daily. Remove & Discard patch within 12 hours or as directed by MD   [Paused] losartan  (COZAAR ) 100 MG tablet TAKE 1 TABLET BY MOUTH EVERY DAY   meloxicam (MOBIC) 15 MG tablet Take 15 mg by mouth daily.   potassium chloride  (KLOR-CON ) 10 MEQ tablet TAKE 1 TABLET BY MOUTH EVERY DAY   pregabalin   (LYRICA ) 100 MG capsule Take 100 mg by mouth at bedtime.   traMADol  (ULTRAM ) 50 MG tablet Take 1 tablet (50 mg total) by mouth every 6 (six) hours as needed.   No facility-administered medications prior to visit.    Review of Systems All negative Except see HPI       Objective    BP 123/87   Pulse 82   Resp 16   Ht 6' (1.829 m)   Wt 215 lb 12.8 oz (97.9 kg)   SpO2 100%   BMI 29.27 kg/m     Physical Exam Vitals reviewed.  Constitutional:      General: He is not in acute distress.    Appearance: Normal appearance. He is not diaphoretic.  HENT:     Head: Normocephalic and atraumatic.  Eyes:     General: No scleral icterus.    Conjunctiva/sclera: Conjunctivae normal.  Cardiovascular:     Rate and Rhythm: Normal rate and regular rhythm.     Pulses: Normal pulses.     Heart sounds: Normal heart sounds. No murmur heard. Pulmonary:     Effort: Pulmonary effort is normal. No respiratory distress.     Breath sounds: Normal breath sounds. No wheezing or rhonchi.  Musculoskeletal:     Cervical back: Neck supple.     Right lower leg: No edema.     Left lower leg: No edema.  Lymphadenopathy:     Cervical: No cervical adenopathy.  Skin:    General: Skin is warm and dry.     Findings: No rash.  Neurological:     Mental Status: He is alert and oriented to person, place, and time. Mental status is at baseline.  Psychiatric:        Mood and Affect: Mood normal.        Behavior: Behavior normal.      No results found for any visits on 02/11/24.      Assessment & Plan Hospital discharge Follow-up Aspiration pneumonia Pneumothorax on right Paroxysmal A fib Hypertension Hyperlipidemia Smoking greater than 40 pack years Obesity Hx of stroke Fall at home Closed rib fracture  Pneumonia, recent hospitalization Recent hospitalization for pneumonia with trace pleural effusion and trace apical pneumothorax. No significant respiratory symptoms. - Order chest X-ray to  monitor resolution. - Instruct to use spirometry for lung rehabilitation. - Advise to seek emergency care if experiencing chest pain, shortness of breath, or palpitations. Advised to complete a course of antibiotics  Right-sided chest pain after fall, possible rib fracture Right-sided chest pain post-fall. Imaging did not confirm rib fracture. Pain management necessary. - Prescribe lidocaine  patches for pain management and tylenol . - Advise against excessive use of controlled substances due to alcohol use. - Order chest X-ray to evaluate for rib fracture.  Atrial fibrillation, newly diagnosed Newly diagnosed  atrial fibrillation. On blood thinners to prevent cardiac events. Emphasized medication adherence. Continue eliquis  5 bid for anticoagulation - Contact cardiologist to schedule appointment for management. - Ensure adherence to prescribed blood thinners.  primary hypertension Blood pressure well-controlled with amlodipine  10, hydrochlorothiazide  25 Emphasized medication adherence. - Continue amlodipine . - Instruct to monitor blood pressure at home and bring readings to next appointment. Continue lifestyle modifications Will follow-up  Anemia, under treatment Anemia under treatment. Blood work needed to monitor status. - Order CBC to monitor anemia status. Advised ferrous sulfate  324mg   Hypothyroidism Chronic Continue taking levothyroxine  50mcg Will follow-up  Alcohol use, ongoing Ongoing alcohol use. Advised to reduce consumption due to pain management and medication interactions. Emphasized bleeding risk with blood thinners and alcohol. - Advise to reduce alcohol consumption to prevent interactions with medications.  Hospital discharge follow-up (Primary)  - DG Chest 2 View - CBC with Differential/Platelet - lidocaine  (LIDODERM ) 5 %; Place 1 patch onto the skin daily. Remove & Discard patch within 12 hours or as directed by MD  Dispense: 30 patch; Refill: 1  Orders  Placed This Encounter  Procedures   DG Chest 2 View    Reason for Exam (SYMPTOM  OR DIAGNOSIS REQUIRED):   s/p aspirational pneumonia    Preferred imaging location?:   [facility]   CBC with Differential/Platelet    Return in about 6 weeks (around 03/24/2024) for chronic disease f/u.   The patient was advised to call back or seek an in-person evaluation if the symptoms worsen or if the condition fails to improve as anticipated.  I discussed the assessment and treatment plan with the patient. The patient was provided an opportunity to ask questions and all were answered. The patient agreed with the plan and demonstrated an understanding of the instructions.  I, Binh Doten, PA-C have reviewed all documentation for this visit. The documentation on 02/11/2024  for the exam, diagnosis, procedures, and orders are all accurate and complete.  Jolynn Spencer, [facility], [facility] [phone] (phone) [phone] (fax)  [facility]

## 2024-02-12 ENCOUNTER — Telehealth: Payer: Self-pay

## 2024-02-12 ENCOUNTER — Other Ambulatory Visit (HOSPITAL_COMMUNITY): Payer: Self-pay

## 2024-02-12 LAB — CBC WITH DIFFERENTIAL/PLATELET
Basophils Absolute: 0.1 x10E3/uL (ref 0.0–0.2)
Basos: 1 %
EOS (ABSOLUTE): 1.1 x10E3/uL — ABNORMAL HIGH (ref 0.0–0.4)
Eos: 7 %
Hematocrit: 44.7 % (ref 37.5–51.0)
Hemoglobin: 14.4 g/dL (ref 13.0–17.7)
Immature Grans (Abs): 0.5 x10E3/uL — ABNORMAL HIGH (ref 0.0–0.1)
Immature Granulocytes: 3 %
Lymphocytes Absolute: 3.8 x10E3/uL — ABNORMAL HIGH (ref 0.7–3.1)
Lymphs: 23 %
MCH: 29.8 pg (ref 26.6–33.0)
MCHC: 32.2 g/dL (ref 31.5–35.7)
MCV: 93 fL (ref 79–97)
Monocytes Absolute: 1.2 x10E3/uL — ABNORMAL HIGH (ref 0.1–0.9)
Monocytes: 7 %
Neutrophils Absolute: 10.2 x10E3/uL — ABNORMAL HIGH (ref 1.4–7.0)
Neutrophils: 59 %
Platelets: 493 x10E3/uL — ABNORMAL HIGH (ref 150–450)
RBC: 4.83 x10E6/uL (ref 4.14–5.80)
RDW: 11.9 % (ref 11.6–15.4)
WBC: 16.9 x10E3/uL — ABNORMAL HIGH (ref 3.4–10.8)

## 2024-02-12 NOTE — Telephone Encounter (Signed)
 Pharmacy Patient Advocate Encounter  Received notification from [facility] that Prior Authorization for Lidocaine  5% patches  has been DENIED.  See denial reason below. No denial letter attached in CMM. Will attach denial letter to Media tab once received.   PA #/Case ID/Reference #: [id number]

## 2024-02-12 NOTE — Telephone Encounter (Signed)
 Pharmacy Patient Advocate Encounter   Received notification from [employer] that prior authorization for Lidocaine  5% patches  is required/requested.   Insurance verification completed.   The patient is insured through [employer] .   Per test claim: PA required; PA submitted to above mentioned insurance via Latent Key/confirmation #/EOC [id number] Status is pending

## 2024-02-15 NOTE — Telephone Encounter (Signed)
 Unfortunately, other approved indications are not applicable for this patient.

## 2024-02-16 ENCOUNTER — Other Ambulatory Visit: Payer: Self-pay | Admitting: Cardiology

## 2024-02-18 ENCOUNTER — Ambulatory Visit

## 2024-02-18 ENCOUNTER — Ambulatory Visit: Admitting: Physician Assistant

## 2024-02-18 ENCOUNTER — Ambulatory Visit: Attending: Cardiology | Admitting: Cardiology

## 2024-02-18 ENCOUNTER — Encounter: Payer: Self-pay | Admitting: Cardiology

## 2024-02-18 VITALS — BP 115/62 | HR 63 | Ht 72.0 in | Wt 216.2 lb

## 2024-02-18 DIAGNOSIS — I480 Paroxysmal atrial fibrillation: Secondary | ICD-10-CM | POA: Diagnosis not present

## 2024-02-18 DIAGNOSIS — I2510 Atherosclerotic heart disease of native coronary artery without angina pectoris: Secondary | ICD-10-CM | POA: Diagnosis not present

## 2024-02-18 DIAGNOSIS — E785 Hyperlipidemia, unspecified: Secondary | ICD-10-CM

## 2024-02-18 DIAGNOSIS — F17200 Nicotine dependence, unspecified, uncomplicated: Secondary | ICD-10-CM

## 2024-02-18 DIAGNOSIS — I10 Essential (primary) hypertension: Secondary | ICD-10-CM

## 2024-02-18 NOTE — Patient Instructions (Signed)
 Medication Instructions:  Your physician recommends that you continue on your current medications as directed. Please refer to the Current Medication list given to you today.   *If you need a refill on your cardiac medications before your next appointment, please call your pharmacy*  Lab Work: No labs ordered today  If you have labs (blood work) drawn today and your tests are completely normal, you will receive your results only by: MyChart Message (if you have MyChart) OR A paper copy in the mail If you have any lab test that is abnormal or we need to change your treatment, we will call you to review the results.  Testing/Procedures: Alejandro Rush- Long Term Monitor Instructions  Your physician has requested you wear a ZIO patch monitor for 14 days.  This is a single patch monitor. [employer] supplies one patch monitor per enrollment. Additional stickers are not available. Please do not apply patch if you will be having a Nuclear Stress Test, Echocardiogram, Cardiac CT, MRI, or Chest Xray during the period you would be wearing the monitor. The patch cannot be worn during these tests. You cannot remove and re-apply the ZIO XT patch monitor.  Your ZIO patch monitor will be mailed 3 day USPS to your address on file. It may take 3-5 days to receive your monitor after you have been enrolled. Once you have received your monitor, please review the enclosed instructions. Your monitor has already been registered assigning a specific monitor serial number to you.  Billing and Patient Assistance Program Information  We have supplied [employer] with any of your insurance information on file for billing purposes.  [employer] offers a sliding scale Patient Assistance Program for patients that do not have insurance, or whose insurance does not completely cover the cost of the ZIO monitor.  You must apply for the Patient Assistance Program to qualify for this discounted rate.  To apply, please call [employer] at [phone],  select option 4, select option 2, ask to apply for Patient Assistance Program. Meredeth will ask your household income, and how many people are in your household. They will quote your out-of-pocket cost based on that information. [employer] will also be able to set up a 28-month, interest-free payment plan if needed.  Applying the monitor   Shave hair from upper left chest.  Hold abrader disc by orange tab. Rub abrader in 40 strokes over the upper left chest as indicated in your monitor instructions.  Clean area with 4 enclosed alcohol pads. Let dry.  Apply patch as indicated in monitor instructions. Patch will be placed under collarbone on left side of chest with arrow pointing upward.  Rub patch adhesive wings for 2 minutes. Remove white label marked 1. Remove the white label marked 2. Rub patch adhesive wings for 2 additional minutes.  While looking in a mirror, press and release button in center of patch. A small green light will flash 3-4 times. This will be your only indicator that the monitor has been turned on.  Do not shower for the first 24 hours. You may shower after the first 24 hours.  Press the button if you feel a symptom. You will hear a small click. Record Date, Time and Symptom in the Patient Logbook.  When you are ready to remove the patch, follow instructions on the last 2 pages of Patient Logbook.  Stick patch monitor into the tabs at the bottom of the return box.  Place Patient Logbook in the blue and white box. Use locking tab  on box and tape box closed securely. The blue and white box has prepaid postage on it. Please place it in the mailbox as soon as possible. Your physician should have your test results approximately 7-14 days after the monitor has been mailed back to [facility].  Call [facility] Customer Care at [phone] if you have questions regarding your ZIO XT patch monitor.  Call them immediately if you see an orange light blinking on your monitor.  If  your monitor falls off in less than 4 days, contact our Monitor department at [phone].  If your monitor becomes loose or falls off after 4 days call [employer] at [phone] for suggestions on securing your monitor.   Follow-Up: At [facility], you and your health needs are our priority.  As part of our continuing mission to provide you with exceptional heart care, our providers are all part of one team.  This team includes your primary Cardiologist (physician) and Advanced Practice Providers or APPs (Physician Assistants and Nurse Practitioners) who all work together to provide you with the care you need, when you need it.  Your next appointment:   1 year(s)  Provider:   You may see Redell Cave, MD or one of the following Advanced Practice Providers on your designated Care Team:   Lonni Meager, NP Lesley Maffucci, PA-C Bernardino Bring, PA-C Cadence Linden, PA-C Tylene Lunch, NP Barnie Hila, NP    We recommend signing up for the patient portal called MyChart.  Sign up information is provided on this After Visit Summary.  MyChart is used to connect with patients for Virtual Visits (Telemedicine).  Patients are able to view lab/test results, encounter notes, upcoming appointments, etc.  Non-urgent messages can be sent to your provider as well.   To learn more about what you can do with MyChart, go to [email or web address].

## 2024-02-18 NOTE — Progress Notes (Signed)
 Sampson Self                                          MRN: [id number]   02/18/2024   The [facility] Quality Team Specialist reviewed this patient medical record for the purposes of chart review for care gap closure. The following were reviewed: chart review for care gap closure-colorectal cancer screening.    [facility]I Quality Team

## 2024-02-18 NOTE — Progress Notes (Signed)
 Cardiology Office Note:    Date:  02/18/2024   ID:  Alejandro Rush, DOB [birth year], MRN [id number]  PCP:  Dineen Channel, PA-C   [facility] Providers Cardiologist:  Redell Cave, MD     Referring MD: Mcarthur Pick, MD   Chief Complaint  Patient presents with   Follow-up    3 week  follow up pt has been doing well with no complaints of chest pain, chest pressure or SOB, medciation reviewed verbally with patient    History of Present Illness:    Alejandro Rush is a 68 y.o. male with a hx of CAD (25% LAD, minimal RCA stenosis), hypertension, hyperlipidemia, CVA 2016, current smoker x 30+ years who presents for follow-up.  Recently seen in the hospital for right-sided chest pain.  Noted to have a brief episode of paroxysmal atrial fibrillation.  BP was also low normal.  Losartan , Cardura  was stopped.  Eliquis  was started.  Denies palpitations, dizziness, chest pain or shortness of breath.  Compliant with Eliquis  as prescribed, denies any bleeding issues.  Prior notes/testing Echo 6/24 EF 55 to 60% Patient had a chest CT lung cancer screening 10/20/2022 showing LAD calcifications.   Past Medical History:  Diagnosis Date   Arthritis    Cerebral infarction (HCC) 12/26/2014   History of stroke 2016   Hyperlipidemia    Hypertension    Hypothyroid    Previous back surgery    Tobacco abuse     Past Surgical History:  Procedure Laterality Date   BACK SURGERY     BACK SURGERY     90's    Current Medications: Current Meds  Medication Sig   amLODipine  (NORVASC ) 10 MG tablet TAKE 1 TABLET BY MOUTH EVERY DAY   amoxicillin -clavulanate (AUGMENTIN ) 875-125 MG tablet Take 1 tablet by mouth 2 (two) times daily.   apixaban  (ELIQUIS ) 5 MG TABS tablet Take 1 tablet (5 mg total) by mouth 2 (two) times daily.   atorvastatin  (LIPITOR) 80 MG tablet TAKE 1 TABLET BY MOUTH DAILY AT 6 PM.   Cholecalciferol (VITAMIN D3) 50 MCG (2000 UT) capsule Take 2,000 Units by mouth at  bedtime.   cyanocobalamin  1000 MCG tablet Take 1,000 mcg by mouth daily.   ferrous sulfate  324 MG TBEC Take 324 mg by mouth daily with breakfast.   hydrochlorothiazide  (HYDRODIURIL ) 25 MG tablet TAKE 1 TABLET (25 MG TOTAL) BY MOUTH DAILY.   levothyroxine  (SYNTHROID ) 50 MCG tablet TAKE 1 TABLET BY MOUTH EVERY DAY   lidocaine  (LIDODERM ) 5 % Place 2 patches onto the skin daily. Remove & Discard patch within 12 hours or as directed by MD   lidocaine  (LIDODERM ) 5 % Place 1 patch onto the skin daily. Remove & Discard patch within 12 hours or as directed by MD   meloxicam (MOBIC) 15 MG tablet Take 15 mg by mouth daily.   potassium chloride  (KLOR-CON ) 10 MEQ tablet TAKE 1 TABLET BY MOUTH EVERY DAY   pregabalin  (LYRICA ) 100 MG capsule Take 100 mg by mouth at bedtime.   traMADol  (ULTRAM ) 50 MG tablet Take 1 tablet (50 mg total) by mouth every 6 (six) hours as needed.   [DISCONTINUED] doxazosin  (CARDURA ) 8 MG tablet TAKE 1 TABLET BY MOUTH EVERY DAY   [DISCONTINUED] losartan  (COZAAR ) 100 MG tablet TAKE 1 TABLET BY MOUTH EVERY DAY     Allergies:   Patient has no known allergies.   Social History   Socioeconomic History   Marital status: Single    Spouse name: Not on file  Number of children: Not on file   Years of education: Not on file   Highest education level: Not on file  Occupational History   Not on file  Tobacco Use   Smoking status: Every Day    Current packs/day: 1.00    Average packs/day: 1 pack/day for 30.0 years (30.0 ttl pk-yrs)    Types: Cigarettes   Smokeless tobacco: Not on file  Vaping Use   Vaping status: Never Used  Substance and Sexual Activity   Alcohol use: Yes    Alcohol/week: 4.0 standard drinks of alcohol    Types: 4 Cans of beer per week    Comment: daily consumption   Drug use: Never   Sexual activity: Not on file  Other Topics Concern   Not on file  Social History Narrative   Not on file   Social Drivers of Health   Financial Resource Strain: Low Risk   (06/16/2023)   Received from [facility]   Overall Financial Resource Strain (CARDIA)    Difficulty of Paying Living Expenses: Not hard at all  Food Insecurity: No Food Insecurity (06/16/2023)   Received from [facility]   Hunger Vital Sign    Within the past 12 months, you worried that your food would run out before you got the money to buy more.: Never true    Within the past 12 months, the food you bought just didn't last and you didn't have money to get more.: Never true  Transportation Needs: No Transportation Needs (06/16/2023)   Received from [facility] - Transportation    In the past 12 months, has lack of transportation kept you from medical appointments or from getting medications?: No    Lack of Transportation (Non-Medical): No  Physical Activity: Inactive (03/02/2023)   Exercise Vital Sign    Days of Exercise per Week: 0 days    Minutes of Exercise per Session: 0 min  Stress: No Stress Concern Present (03/02/2023)   [employer] of Occupational [facility] - Occupational Stress Questionnaire    Feeling of Stress : Only a little  Social Connections: Moderately Isolated (03/02/2023)   Social Connection and Isolation Panel    Frequency of Communication with Friends and Family: More than three times a week    Frequency of Social Gatherings with Friends and Family: Twice a week    Attends Religious Services: Never    Database administrator or Organizations: No    Attends Engineer, structural: Never    Marital Status: Living with partner     Family History: The patient's family history includes Diabetes in his mother; Heart attack in his maternal uncle and maternal uncle.  ROS:   Please see the history of present illness.     All other systems reviewed and are negative.  EKGs/Labs/Other Studies Reviewed:    The following studies were reviewed today:   EKG:  EKG not ordered today.    Recent  Labs: 08/07/2023: TSH 1.760 02/06/2024: ALT 21; BUN 21; Creatinine, Ser 1.35; Magnesium 1.8; Potassium 3.9; Sodium 134 02/11/2024: Hemoglobin 14.4; Platelets 493  Recent Lipid Panel    Component Value Date/Time   CHOL 137 08/07/2023 1106   TRIG 72 08/07/2023 1106   HDL 49 08/07/2023 1106   CHOLHDL 4.5 11/03/2022 1017   VLDL 34 11/03/2022 1017   LDLCALC 74 08/07/2023 1106     Risk Assessment/Calculations:  Physical Exam:    VS:  BP 115/62 (BP Location: Left Arm, Patient Position: Sitting, Cuff Size: Normal)   Pulse 63   Ht 6' (1.829 m)   Wt 216 lb 3.2 oz (98.1 kg)   SpO2 98%   BMI 29.32 kg/m     Wt Readings from Last 3 Encounters:  02/18/24 216 lb 3.2 oz (98.1 kg)  02/11/24 215 lb 12.8 oz (97.9 kg)  02/07/24 206 lb 5.6 oz (93.6 kg)     GEN:  Well nourished, well developed in no acute distress HEENT: Normal NECK: No JVD; No carotid bruits CARDIAC: RRR, no murmurs, rubs, gallops RESPIRATORY:  Clear to auscultation without rales, wheezing or rhonchi  ABDOMEN: Soft, non-tender, non-distended MUSCULOSKELETAL:  No edema; No deformity  SKIN: Warm and dry NEUROLOGIC:  Alert and oriented x 3 PSYCHIATRIC:  Normal affect   ASSESSMENT:    1. Paroxysmal atrial fibrillation (HCC)   2. Coronary artery disease involving native coronary artery of native heart, unspecified whether angina present   3. Hyperlipidemia LDL goal <70   4. Primary hypertension   5. Current smoker     PLAN:    In order of problems listed above:  Paroxysmal atrial fibrillation, heart rate adequately controlled, clinically asymptomatic off AV nodal agents.  Continue Eliquis  5 mg twice daily.  Order cardiac monitor to evaluate A-fib burden Mild nonobstructive CAD 25% proximal LAD, minimal RCA stenosis.  Denies chest pain.  Continue aspirin  81 mg daily, Lipitor 80 mg daily.  Echo 6/24 EF 55 to 60%. Hyperlipidemia, cholesterol controlled.  Continue Lipitor 80 mg daily. Hypertension, BP  controlled.  Continue amlodipine  10, HCTZ 25 mg daily.  Smoking, cessation advised.  Follow-up in 12 months     Medication Adjustments/Labs and Tests Ordered: Current medicines are reviewed at length with the patient today.  Concerns regarding medicines are outlined above.  Orders Placed This Encounter  Procedures   LONG TERM MONITOR (3-14 DAYS)   No orders of the defined types were placed in this encounter.   Patient Instructions  Medication Instructions:  Your physician recommends that you continue on your current medications as directed. Please refer to the Current Medication list given to you today.   *If you need a refill on your cardiac medications before your next appointment, please call your pharmacy*  Lab Work: No labs ordered today  If you have labs (blood work) drawn today and your tests are completely normal, you will receive your results only by: MyChart Message (if you have MyChart) OR A paper copy in the mail If you have any lab test that is abnormal or we need to change your treatment, we will call you to review the results.  Testing/Procedures: GEOFFRY HEWS- Long Term Monitor Instructions  Your physician has requested you wear a ZIO patch monitor for 14 days.  This is a single patch monitor. [employer] supplies one patch monitor per enrollment. Additional stickers are not available. Please do not apply patch if you will be having a Nuclear Stress Test, Echocardiogram, Cardiac CT, MRI, or Chest Xray during the period you would be wearing the monitor. The patch cannot be worn during these tests. You cannot remove and re-apply the ZIO XT patch monitor.  Your ZIO patch monitor will be mailed 3 day USPS to your address on file. It may take 3-5 days to receive your monitor after you have been enrolled. Once you have received your monitor, please review the enclosed instructions. Your monitor has already been registered assigning  a specific monitor serial number to you.  Billing  and Patient Assistance Program Information  We have supplied [employer] with any of your insurance information on file for billing purposes.  [employer] offers a sliding scale Patient Assistance Program for patients that do not have insurance, or whose insurance does not completely cover the cost of the ZIO monitor.  You must apply for the Patient Assistance Program to qualify for this discounted rate.  To apply, please call [employer] at [phone], select option 4, select option 2, ask to apply for Patient Assistance Program. Meredeth will ask your household income, and how many people are in your household. They will quote your out-of-pocket cost based on that information. [employer] will also be able to set up a 78-month, interest-free payment plan if needed.  Applying the monitor   Shave hair from upper left chest.  Hold abrader disc by orange tab. Rub abrader in 40 strokes over the upper left chest as indicated in your monitor instructions.  Clean area with 4 enclosed alcohol pads. Let dry.  Apply patch as indicated in monitor instructions. Patch will be placed under collarbone on left side of chest with arrow pointing upward.  Rub patch adhesive wings for 2 minutes. Remove white label marked 1. Remove the white label marked 2. Rub patch adhesive wings for 2 additional minutes.  While looking in a mirror, press and release button in center of patch. A small green light will flash 3-4 times. This will be your only indicator that the monitor has been turned on.  Do not shower for the first 24 hours. You may shower after the first 24 hours.  Press the button if you feel a symptom. You will hear a small click. Record Date, Time and Symptom in the Patient Logbook.  When you are ready to remove the patch, follow instructions on the last 2 pages of Patient Logbook.  Stick patch monitor into the tabs at the bottom of the return box.  Place Patient Logbook in the blue and white box. Use locking tab on box and  tape box closed securely. The blue and white box has prepaid postage on it. Please place it in the mailbox as soon as possible. Your physician should have your test results approximately 7-14 days after the monitor has been mailed back to [facility].  Call [facility] Customer Care at [phone] if you have questions regarding your ZIO XT patch monitor.  Call them immediately if you see an orange light blinking on your monitor.  If your monitor falls off in less than 4 days, contact our Monitor department at [phone].  If your monitor becomes loose or falls off after 4 days call [employer] at [phone] for suggestions on securing your monitor.   Follow-Up: At [facility], you and your health needs are our priority.  As part of our continuing mission to provide you with exceptional heart care, our providers are all part of one team.  This team includes your primary Cardiologist (physician) and Advanced Practice Providers or APPs (Physician Assistants and Nurse Practitioners) who all work together to provide you with the care you need, when you need it.  Your next appointment:   1 year(s)  Provider:   You may see Redell Cave, MD or one of the following Advanced Practice Providers on your designated Care Team:   Lonni Meager, NP Lesley Maffucci, PA-C Bernardino Bring, PA-C Cadence Triana, PA-C Tylene Lunch, NP Barnie Hila, NP    We recommend signing up  for the patient portal called MyChart.  Sign up information is provided on this After Visit Summary.  MyChart is used to connect with patients for Virtual Visits (Telemedicine).  Patients are able to view lab/test results, encounter notes, upcoming appointments, etc.  Non-urgent messages can be sent to your provider as well.   To learn more about what you can do with MyChart, go to [email or web address].             Signed, Redell Cave, MD  02/18/2024 11:56 AM    [facility]

## 2024-02-19 ENCOUNTER — Encounter: Payer: Self-pay | Admitting: Physician Assistant

## 2024-02-19 ENCOUNTER — Ambulatory Visit (INDEPENDENT_AMBULATORY_CARE_PROVIDER_SITE_OTHER): Admitting: Physician Assistant

## 2024-02-19 VITALS — BP 136/87 | HR 62 | Resp 16 | Ht 72.0 in | Wt 218.8 lb

## 2024-02-19 DIAGNOSIS — Z9189 Other specified personal risk factors, not elsewhere classified: Secondary | ICD-10-CM

## 2024-02-19 DIAGNOSIS — E7849 Other hyperlipidemia: Secondary | ICD-10-CM

## 2024-02-19 DIAGNOSIS — I480 Paroxysmal atrial fibrillation: Secondary | ICD-10-CM

## 2024-02-19 DIAGNOSIS — F17210 Nicotine dependence, cigarettes, uncomplicated: Secondary | ICD-10-CM | POA: Diagnosis not present

## 2024-02-19 DIAGNOSIS — I10 Essential (primary) hypertension: Secondary | ICD-10-CM

## 2024-02-19 DIAGNOSIS — R0789 Other chest pain: Secondary | ICD-10-CM | POA: Diagnosis not present

## 2024-02-19 NOTE — Progress Notes (Signed)
 Established patient visit  Patient: Alejandro Rush   DOB: Jan 17, 1956   68 y.o. Male  MRN: [id number] Visit Date: 02/19/2024  Today's healthcare provider: Jolynn Spencer, PA-C   Chief Complaint  Patient presents with   Medical Management of Chronic Issues    HTN- bp been good, a little elevated yesterday afternoon and this morning. Has bp readings with him.   Subjective     HPI     Medical Management of Chronic Issues    Additional comments: HTN- bp been good, a little elevated yesterday afternoon and this morning. Has bp readings with him.      Last edited by Wilfred Hargis RAMAN, CMA on 02/19/2024  8:54 AM.       Discussed the use of AI scribe software for clinical note transcription with the patient, who gave verbal consent to proceed.  History of Present Illness Alejandro Rush is a 68 year old male with paroxysmal atrial fibrillation who presents for medication management and follow-up.  He is on Eliquis  5 mg twice daily for paroxysmal atrial fibrillation and has discontinued digoxin, losartan , and aspirin . He missed one dose of Eliquis  recently and is currently on a 14-day cardiac monitor.  He has hypertension, managed with amlodipine  10 mg and hydrochlorothiazide  25 mg daily. His blood pressure recently increased to 145/159 mmHg, attributed to a delay in medication intake.  He takes atorvastatin  80 mg for hyperlipidemia as prescribed.  He has reduced smoking to eight cigarettes per day and has abstained from alcohol for two weeks.  He experiences periodic rib pain, described as tender, which occurs intermittently. He avoids activities like mowing the yard or raking to prevent pain exacerbation. No chest pain.       07/21/2023   11:07 AM 03/02/2023   10:43 AM  Depression screen PHQ 2/9  Decreased Interest 0 0  Down, Depressed, Hopeless 0 0  PHQ - 2 Score 0 0  Altered sleeping 2   Tired, decreased energy 1   Change in appetite 0   Feeling bad or failure about yourself   0   Trouble concentrating 0   Moving slowly or fidgety/restless 0   Suicidal thoughts 0   PHQ-9 Score 3   Difficult doing work/chores Somewhat difficult       07/21/2023   11:07 AM  GAD 7 : Generalized Anxiety Score  Nervous, Anxious, on Edge 0  Control/stop worrying 0  Worry too much - different things 0  Trouble relaxing 0  Restless 0  Easily annoyed or irritable 0  Afraid - awful might happen 0  Total GAD 7 Score 0  Anxiety Difficulty Somewhat difficult    Medications: Outpatient Medications Prior to Visit  Medication Sig   amLODipine  (NORVASC ) 10 MG tablet TAKE 1 TABLET BY MOUTH EVERY DAY   amoxicillin -clavulanate (AUGMENTIN ) 875-125 MG tablet Take 1 tablet by mouth 2 (two) times daily.   apixaban  (ELIQUIS ) 5 MG TABS tablet Take 1 tablet (5 mg total) by mouth 2 (two) times daily.   atorvastatin  (LIPITOR) 80 MG tablet TAKE 1 TABLET BY MOUTH DAILY AT 6 PM.   Cholecalciferol (VITAMIN D3) 50 MCG (2000 UT) capsule Take 2,000 Units by mouth at bedtime.   cyanocobalamin  1000 MCG tablet Take 1,000 mcg by mouth daily.   ferrous sulfate  324 MG TBEC Take 324 mg by mouth daily with breakfast.   hydrochlorothiazide  (HYDRODIURIL ) 25 MG tablet TAKE 1 TABLET (25 MG TOTAL) BY MOUTH DAILY.   levothyroxine  (SYNTHROID ) 50 MCG tablet TAKE 1 TABLET  BY MOUTH EVERY DAY   lidocaine  (LIDODERM ) 5 % Place 2 patches onto the skin daily. Remove & Discard patch within 12 hours or as directed by MD   lidocaine  (LIDODERM ) 5 % Place 1 patch onto the skin daily. Remove & Discard patch within 12 hours or as directed by MD   meloxicam (MOBIC) 15 MG tablet Take 15 mg by mouth daily.   potassium chloride  (KLOR-CON ) 10 MEQ tablet TAKE 1 TABLET BY MOUTH EVERY DAY   pregabalin  (LYRICA ) 100 MG capsule Take 100 mg by mouth at bedtime.   traMADol  (ULTRAM ) 50 MG tablet Take 1 tablet (50 mg total) by mouth every 6 (six) hours as needed.   No facility-administered medications prior to visit.    Review of Systems All  negative Except see HPI       Objective    BP 136/87   Pulse 62   Resp 16   Ht 6' (1.829 m)   Wt 218 lb 12.8 oz (99.2 kg)   SpO2 100%   BMI 29.67 kg/m     Physical Exam Vitals reviewed.  Constitutional:      General: He is not in acute distress.    Appearance: Normal appearance. He is not diaphoretic.  HENT:     Head: Normocephalic and atraumatic.  Eyes:     General: No scleral icterus.    Conjunctiva/sclera: Conjunctivae normal.  Cardiovascular:     Rate and Rhythm: Normal rate and regular rhythm.     Pulses: Normal pulses.     Heart sounds: Normal heart sounds. No murmur heard. Pulmonary:     Effort: Pulmonary effort is normal. No respiratory distress.     Breath sounds: Normal breath sounds. No wheezing or rhonchi.  Musculoskeletal:     Cervical back: Neck supple.     Right lower leg: No edema.     Left lower leg: No edema.  Lymphadenopathy:     Cervical: No cervical adenopathy.  Skin:    General: Skin is warm and dry.     Findings: No rash.  Neurological:     Mental Status: He is alert and oriented to person, place, and time. Mental status is at baseline.  Psychiatric:        Mood and Affect: Mood normal.        Behavior: Behavior normal.      No results found for any visits on 02/19/24.      Assessment & Plan Paroxysmal atrial fibrillation Managed with Eliquis , transitioned from aspirin  81,  losartan , and doxazosin  were discontinued per cardiologist's recommendation.  He was placed on Holter monitoring - Continue Eliquis  5 mg twice daily. Managed by cardiology Will follow-up  Primary hypertension Blood pressure well-controlled with current medication. - Continue amlodipine  10 mg daily. - Continue hydrochlorothiazide  25 mg daily. - Monitor blood pressure regularly.  Hyperlipidemia Chronic and stable  Continue with low-cholesterol diet and regular exercise as tolerated  Will managed with atorvastatin . - Continue atorvastatin  80 mg  daily. Follow-up  Smoking greater than 40 pack years Nicotine dependence, cigarettes Chronic  currently smoking approximately 8 cigarettes per day, reduced from previous levels. - Encourage continued reduction in smoking. Will revisit  Alcohol dependence, in early remission Chronic  Alcohol level from 02/10/24 was 20 in early remission, abstained for two weeks. - Encourage continued abstinence from alcohol. We will revisit  Intermittent rib pain Described as periodically tender. Advised against activities that may exacerbate pain. - Avoid activities that may exacerbate rib pain, such as  mowing the yard or raking. Will follow-up    No orders of the defined types were placed in this encounter.   Return in about 6 weeks (around 04/01/2024) for chronic disease f/u in 6 wks to  .   The patient was advised to call back or seek an in-person evaluation if the symptoms worsen or if the condition fails to improve as anticipated.  I discussed the assessment and treatment plan with the patient. The patient was provided an opportunity to ask questions and all were answered. The patient agreed with the plan and demonstrated an understanding of the instructions.  I, Maryetta Shafer, PA-C have reviewed all documentation for this visit. The documentation on 02/19/2024  for the exam, diagnosis, procedures, and orders are all accurate and complete.  Jolynn Spencer, [facility], MMS [facility] [phone] (phone) [phone] (fax)  [facility]

## 2024-02-22 ENCOUNTER — Other Ambulatory Visit: Payer: Self-pay | Admitting: Physician Assistant

## 2024-03-01 NOTE — Progress Notes (Signed)
 Alejandro Rush                                          MRN: [id number]   03/01/2024   The VBCI Quality Team Specialist reviewed this patient medical record for the purposes of chart review for care gap closure. The following were reviewed: chart review for care gap closure-colorectal cancer screening.    VBCI Quality Team

## 2024-03-04 ENCOUNTER — Other Ambulatory Visit: Payer: Self-pay | Admitting: Physician Assistant

## 2024-03-04 NOTE — Telephone Encounter (Unsigned)
 Copied from CRM #[id number]. Topic: Clinical - Medication Refill >> Mar 04, 2024 12:18 PM Teressa P wrote: Medication: apixaban  5 mg twice a day  Has the patient contacted their pharmacy? Yes  This is the patient's preferred pharmacy:  [facility] [address], [place] - [address] [place] [address] Phone: [phone] Fax: [phone]  Is this the correct pharmacy for this prescription? Yes If no, delete pharmacy and type the correct one.   Has the prescription been filled recently? Yes  Is the patient out of the medication? No  Has the patient been seen for an appointment in the last year OR does the patient have an upcoming appointment? Yes  Can we respond through MyChart? No  Agent: Please be advised that Rx refills may take up to 3 business days. We ask that you follow-up with your pharmacy.

## 2024-03-07 MED ORDER — APIXABAN 5 MG PO TABS: 5.0000 mg | ORAL_TABLET | Freq: Two times a day (BID) | ORAL | 0 refills | Status: DC

## 2024-03-07 NOTE — Telephone Encounter (Signed)
 Requested medications are due for refill today.  yes  Requested medications are on the active medications list.  yes  Last refill. 02/07/2024 #60 0 rf  Future visit scheduled.   yes  Notes to clinic.  New medication to this pt. Rx signed by Dr. Sigdel.    Requested Prescriptions  Pending Prescriptions Disp Refills   apixaban  (ELIQUIS ) 5 MG TABS tablet 60 tablet 0    Sig: Take 1 tablet (5 mg total) by mouth 2 (two) times daily.     Hematology:  Anticoagulants - apixaban  Failed - 03/07/2024  2:49 PM      Failed - PLT in normal range and within 360 days    Platelets  Date Value Ref Range Status  02/11/2024 493 (H) 150 - 450 x10E3/uL Final         Failed - Cr in normal range and within 360 days    Creatinine, Ser  Date Value Ref Range Status  02/06/2024 1.35 (H) 0.61 - 1.24 mg/dL Final   Creatinine, Urine  Date Value Ref Range Status  02/06/2024 39 mg/dL Final         Passed - HGB in normal range and within 360 days    Hemoglobin  Date Value Ref Range Status  02/11/2024 14.4 13.0 - 17.7 g/dL Final         Passed - HCT in normal range and within 360 days    Hematocrit  Date Value Ref Range Status  02/11/2024 44.7 37.5 - 51.0 % Final         Passed - AST in normal range and within 360 days    AST  Date Value Ref Range Status  02/06/2024 24 15 - 41 U/L Final         Passed - ALT in normal range and within 360 days    ALT  Date Value Ref Range Status  02/06/2024 21 0 - 44 U/L Final         Passed - Valid encounter within last 12 months    Recent Outpatient Visits           2 weeks ago At risk alcohol consumption   [facility] [place], [place], PA-C   3 weeks ago Hospital discharge follow-up   [facility] [place], [place], PA-C   7 months ago Primary hypertension   [facility] [place] [facility] [place], PA-C   7 months ago At risk alcohol consumption   [facility]  [place], [place], PA-C

## 2024-03-08 ENCOUNTER — Ambulatory Visit (INDEPENDENT_AMBULATORY_CARE_PROVIDER_SITE_OTHER): Payer: Self-pay

## 2024-03-08 VITALS — BP 138/82 | Ht 72.0 in | Wt 214.5 lb

## 2024-03-08 DIAGNOSIS — Z0000 Encounter for general adult medical examination without abnormal findings: Secondary | ICD-10-CM | POA: Diagnosis not present

## 2024-03-08 NOTE — Patient Instructions (Addendum)
 Alejandro Rush,  Thank you for taking the time for your Medicare Wellness Visit. I appreciate your continued commitment to your health goals. Please review the care plan we discussed, and feel free to reach out if I can assist you further.  Medicare recommends these wellness visits once per year to help you and your care team stay ahead of potential health issues. These visits are designed to focus on prevention, allowing your provider to concentrate on managing your acute and chronic conditions during your regular appointments.  Please note that Annual Wellness Visits do not include a physical exam. Some assessments may be limited, especially if the visit was conducted virtually. If needed, we may recommend a separate in-person follow-up with your provider.  Ongoing Care Seeing your primary care provider every 3 to 6 months helps us  monitor your health and provide consistent, personalized care.   Referrals If a referral was made during today's visit and you haven't received any updates within two weeks, please contact the referred provider directly to check on the status.  Recommended Screenings:  Health Maintenance  Topic Date Due   DTaP/Tdap/Td vaccine (1 - Tdap) Never done   Pneumococcal Vaccine for age over 17 (1 of 2 - PCV) Never done   Colon Cancer Screening  Never done   Zoster (Shingles) Vaccine (1 of 2) Never done   Flu Shot  Never done   COVID-19 Vaccine (1 - 2025-26 season) Never done   Screening for Lung Cancer  02/05/2025   Medicare Annual Wellness Visit  03/08/2025   Hepatitis C Screening  Completed   Meningitis B Vaccine  Aged Out     Advance Care Planning is important because it: Ensures you receive medical care that aligns with your values, goals, and preferences. Provides guidance to your family and loved ones, reducing the emotional burden of decision-making during critical moments.  Vision: Annual vision screenings are recommended for early detection of glaucoma,  cataracts, and diabetic retinopathy. These exams can also reveal signs of chronic conditions such as diabetes and high blood pressure.  Dental: Annual dental screenings help detect early signs of oral cancer, gum disease, and other conditions linked to overall health, including heart disease and diabetes.  Please see the attached documents for additional preventive care recommendations.   NEXT AWV 03/14/25 @ 8:10 AM IN PERSON

## 2024-03-08 NOTE — Progress Notes (Signed)
 Subjective:   Alejandro Rush is a 68 y.o. who presents for a Medicare Wellness preventive visit.  As a reminder, Annual Wellness Visits don't include a physical exam, and some assessments may be limited, especially if this visit is performed virtually. We may recommend an in-person follow-up visit with your provider if needed.  Visit Complete: In person  Persons Participating in Visit: Patient.  AWV Questionnaire: No: Patient Medicare AWV questionnaire was not completed prior to this visit.  Cardiac Risk Factors include: advanced age (>74men, >44 women);dyslipidemia;male gender;hypertension;smoking/ tobacco exposure     Objective:    Today's Vitals   03/08/24 0812  BP: 138/82  Weight: 214 lb 8 oz (97.3 kg)  Height: 6' (1.829 m)   Body mass index is 29.09 kg/m.     03/08/2024    8:23 AM 02/06/2024   12:59 AM 03/02/2023   10:47 AM 12/26/2014    6:31 PM 12/26/2014    3:09 PM  Advanced Directives  Does Patient Have a Medical Advance Directive? No No No No  No   Would patient like information on creating a medical advance directive? No - Patient declined   No - patient declined information  Yes - Educational materials given      Data saved with a previous flowsheet row definition    Current Medications (verified) Outpatient Encounter Medications as of 03/08/2024  Medication Sig   amLODipine  (NORVASC ) 10 MG tablet TAKE 1 TABLET BY MOUTH EVERY DAY   apixaban  (ELIQUIS ) 5 MG TABS tablet Take 1 tablet (5 mg total) by mouth 2 (two) times daily.   atorvastatin  (LIPITOR) 80 MG tablet TAKE 1 TABLET BY MOUTH DAILY AT 6 PM.   Cholecalciferol (VITAMIN D3) 50 MCG (2000 UT) capsule Take 2,000 Units by mouth at bedtime.   cyanocobalamin  1000 MCG tablet Take 1,000 mcg by mouth daily.   ferrous sulfate  324 MG TBEC Take 324 mg by mouth daily with breakfast.   hydrochlorothiazide  (HYDRODIURIL ) 25 MG tablet TAKE 1 TABLET (25 MG TOTAL) BY MOUTH DAILY.   levothyroxine  (SYNTHROID ) 50 MCG tablet  TAKE 1 TABLET BY MOUTH EVERY DAY   lidocaine  (LIDODERM ) 5 % Place 2 patches onto the skin daily. Remove & Discard patch within 12 hours or as directed by MD   lidocaine  (LIDODERM ) 5 % Place 1 patch onto the skin daily. Remove & Discard patch within 12 hours or as directed by MD   meloxicam (MOBIC) 15 MG tablet Take 15 mg by mouth daily.   potassium chloride  (KLOR-CON ) 10 MEQ tablet TAKE 1 TABLET BY MOUTH EVERY DAY   pregabalin  (LYRICA ) 100 MG capsule Take 100 mg by mouth at bedtime.   amoxicillin -clavulanate (AUGMENTIN ) 875-125 MG tablet Take 1 tablet by mouth 2 (two) times daily. (Patient not taking: Reported on 03/08/2024)   traMADol  (ULTRAM ) 50 MG tablet Take 1 tablet (50 mg total) by mouth every 6 (six) hours as needed. (Patient not taking: Reported on 03/08/2024)   No facility-administered encounter medications on file as of 03/08/2024.    Allergies (verified) Patient has no known allergies.   History: Past Medical History:  Diagnosis Date   Arthritis    Cerebral infarction (HCC) 12/26/2014   History of stroke 2016   Hyperlipidemia    Hypertension    Hypothyroid    Previous back surgery    Tobacco abuse    Past Surgical History:  Procedure Laterality Date   BACK SURGERY     BACK SURGERY     90's   Family  History  Problem Relation Age of Onset   Diabetes Mother    Heart attack Maternal Uncle    Heart attack Maternal Uncle    Social History   Socioeconomic History   Marital status: Single    Spouse name: Not on file   Number of children: Not on file   Years of education: Not on file   Highest education level: Not on file  Occupational History   Not on file  Tobacco Use   Smoking status: Every Day    Current packs/day: 1.00    Average packs/day: 1 pack/day for 30.0 years (30.0 ttl pk-yrs)    Types: Cigarettes   Smokeless tobacco: Not on file  Vaping Use   Vaping status: Never Used  Substance and Sexual Activity   Alcohol use: Yes    Alcohol/week: 4.0  standard drinks of alcohol    Types: 4 Cans of beer per week    Comment: daily consumption   Drug use: Never   Sexual activity: Not on file  Other Topics Concern   Not on file  Social History Narrative   Not on file   Social Drivers of Health   Financial Resource Strain: Low Risk  (03/08/2024)   Overall Financial Resource Strain (CARDIA)    Difficulty of Paying Living Expenses: Not hard at all  Food Insecurity: No Food Insecurity (03/08/2024)   Hunger Vital Sign    Worried About Running Out of Food in the Last Year: Never true    Ran Out of Food in the Last Year: Never true  Transportation Needs: No Transportation Needs (03/08/2024)   PRAPARE - Administrator, Civil Service (Medical): No    Lack of Transportation (Non-Medical): No  Physical Activity: Insufficiently Active (03/08/2024)   Exercise Vital Sign    Days of Exercise per Week: 4 days    Minutes of Exercise per Session: 30 min  Stress: No Stress Concern Present (03/08/2024)   [employer] of Occupational Health - Occupational Stress Questionnaire    Feeling of Stress: Not at all  Social Connections: Moderately Isolated (03/08/2024)   Social Connection and Isolation Panel    Frequency of Communication with Friends and Family: More than three times a week    Frequency of Social Gatherings with Friends and Family: Not on file    Attends Religious Services: Never    Database administrator or Organizations: No    Attends Engineer, structural: Never    Marital Status: Living with partner    Tobacco Counseling Ready to quit: Not Answered Counseling given: Not Answered    Clinical Intake:  Pre-visit preparation completed: Yes  Pain : No/denies pain     BMI - recorded: 29.09 Nutritional Status: BMI 25 -29 Overweight Nutritional Risks: None Diabetes: No  Lab Results  Component Value Date   HGBA1C 5.9 (H) 02/26/2023     How often do you need to have someone help you when you read  instructions, pamphlets, or other written materials from your doctor or pharmacy?: 1 - Never  Interpreter Needed?: No  Information entered by :: Alejandro DAS, LPN   Activities of Daily Living     03/08/2024    8:24 AM  In your present state of health, do you have any difficulty performing the following activities:  Hearing? 0  Vision? 0  Difficulty concentrating or making decisions? 0  Walking or climbing stairs? 1  Comment KNEE PAIN  Dressing or bathing? 0  Doing errands, shopping?  0  Preparing Food and eating ? N  Using the Toilet? N  In the past six months, have you accidently leaked urine? N  Do you have problems with loss of bowel control? N  Managing your Medications? N  Managing your Finances? N  Housekeeping or managing your Housekeeping? N    Patient Care Team: Ostwalt, Janna, PA-C as PCP - General (Physician Assistant) Darliss Rogue, MD as PCP - Cardiology (Cardiology) Enola Feliciano Hugger, MD as Consulting Physician (Ophthalmology)  I have updated your Care Teams any recent Medical Services you may have received from other providers in the past year.     Assessment:   This is a routine wellness examination for Alejandro Rush.  Hearing/Vision screen Hearing Screening - Comments:: NO AIDS Vision Screening - Comments:: PX GLASSES- [facility]   Goals Addressed             This Visit's Progress    DIET - EAT MORE FRUITS AND VEGETABLES         Depression Screen     03/08/2024    8:21 AM 07/21/2023   11:07 AM 03/02/2023   10:43 AM  PHQ 2/9 Scores  PHQ - 2 Score 0 0 0  PHQ- 9 Score 0 3     Fall Risk     03/08/2024    8:24 AM 03/02/2023   10:38 AM  Fall Risk   Falls in the past year? 1 0  Number falls in past yr: 0 0  Injury with Fall? 1 0  Risk for fall due to :  No Fall Risks  Follow up Falls evaluation completed;Falls prevention discussed Education provided;Falls prevention discussed    MEDICARE RISK AT HOME:  Medicare Risk at Home Any  stairs in or around the home?: Yes If so, are there any without handrails?: No Home free of loose throw rugs in walkways, pet beds, electrical cords, etc?: Yes Adequate lighting in your home to reduce risk of falls?: Yes Life alert?: No Use of a cane, walker or w/c?: No Grab bars in the bathroom?: No Shower chair or bench in shower?: No Elevated toilet seat or a handicapped toilet?: Yes  TIMED UP AND GO:  Was the test performed?  Yes  Length of time to ambulate 10 feet: 4 sec Gait steady and fast without use of assistive device  Cognitive Function: 6CIT completed        03/08/2024    8:26 AM 03/02/2023   10:52 AM  6CIT Screen  What Year? 0 points 0 points  What month? 0 points 0 points  What time? 0 points 0 points  Count back from 20 0 points 0 points  Months in reverse 0 points 4 points  Repeat phrase 0 points 2 points  Total Score 0 points 6 points    Immunizations  There is no immunization history on file for this patient.  Screening Tests Health Maintenance  Topic Date Due   DTaP/Tdap/Td (1 - Tdap) Never done   Pneumococcal Vaccine: 50+ Years (1 of 2 - PCV) Never done   Colonoscopy  Never done   Zoster Vaccines- Shingrix (1 of 2) Never done   Influenza Vaccine  Never done   COVID-19 Vaccine (1 - 2025-26 season) Never done   Lung Cancer Screening  02/05/2025   Medicare Annual Wellness (AWV)  03/08/2025   Hepatitis C Screening  Completed   Meningococcal B Vaccine  Aged Out    Health Maintenance Items Addressed: DECLINES ALL VACCINES; DECLINES REFERRAL  FOR COLONOSCOPY; UP TO DATE ON LUNG CA SCREENING  Additional Screening:  Vision Screening: Recommended annual ophthalmology exams for early detection of glaucoma and other disorders of the eye. Is the patient up to date with their annual eye exam?  Yes  Who is the provider or what is the name of the office in which the patient attends annual eye exams? [facility]  Dental Screening: Recommended annual  dental exams for proper oral hygiene  Community Resource Referral / Chronic Care Management: CRR required this visit?  No   CCM required this visit?  No   Plan:    I have personally reviewed and noted the following in the patient's chart:   Medical and social history Use of alcohol, tobacco or illicit drugs  Current medications and supplements including opioid prescriptions. Patient is not currently taking opioid prescriptions. Functional ability and status Nutritional status Physical activity Advanced directives List of other physicians Hospitalizations, surgeries, and ER visits in previous 12 months Vitals Screenings to include cognitive, depression, and falls Referrals and appointments  In addition, I have reviewed and discussed with patient certain preventive protocols, quality metrics, and best practice recommendations. A written personalized care plan for preventive services as well as general preventive health recommendations were provided to patient.   Alejandro GORMAN Das, LPN   [id number]   After Visit Summary: (In Person-Declined) Patient declined AVS at this time.  Notes: Nothing significant to report at this time. DECLINES ALL VACCINES AND COLONOSCOPY REFERRAL

## 2024-03-10 ENCOUNTER — Ambulatory Visit: Payer: Self-pay | Admitting: Cardiology

## 2024-03-10 DIAGNOSIS — I480 Paroxysmal atrial fibrillation: Secondary | ICD-10-CM

## 2024-03-16 ENCOUNTER — Other Ambulatory Visit: Payer: Self-pay | Admitting: Physician Assistant

## 2024-04-01 ENCOUNTER — Encounter: Payer: Self-pay | Admitting: Physician Assistant

## 2024-04-01 ENCOUNTER — Ambulatory Visit (INDEPENDENT_AMBULATORY_CARE_PROVIDER_SITE_OTHER): Admitting: Physician Assistant

## 2024-04-01 VITALS — BP 148/94 | HR 65 | Resp 14 | Ht 72.0 in | Wt 213.2 lb

## 2024-04-01 DIAGNOSIS — D649 Anemia, unspecified: Secondary | ICD-10-CM

## 2024-04-01 DIAGNOSIS — Z23 Encounter for immunization: Secondary | ICD-10-CM

## 2024-04-01 DIAGNOSIS — E039 Hypothyroidism, unspecified: Secondary | ICD-10-CM | POA: Diagnosis not present

## 2024-04-01 DIAGNOSIS — I10 Essential (primary) hypertension: Secondary | ICD-10-CM | POA: Diagnosis not present

## 2024-04-01 DIAGNOSIS — F17210 Nicotine dependence, cigarettes, uncomplicated: Secondary | ICD-10-CM

## 2024-04-01 DIAGNOSIS — Z9189 Other specified personal risk factors, not elsewhere classified: Secondary | ICD-10-CM

## 2024-04-01 DIAGNOSIS — E7849 Other hyperlipidemia: Secondary | ICD-10-CM

## 2024-04-01 MED ORDER — VALSARTAN 40 MG PO TABS: 40.0000 mg | ORAL_TABLET | Freq: Every day | ORAL | 3 refills | Status: AC

## 2024-04-01 NOTE — Progress Notes (Signed)
 Established patient visit  Patient: Alejandro Rush   DOB: 01/28/1956   68 y.o. Male  MRN: [id number] Visit Date: 04/01/2024  Today's healthcare provider: Jolynn Spencer, PA-C   Chief Complaint  Patient presents with   Medical Management of Chronic Issues    HTN- doing pretty good   Subjective     Discussed the use of AI scribe software for clinical note transcription with the patient, who gave verbal consent to proceed.  History of Present Illness Alejandro Rush is a 68 year old male with hypertension and atrial fibrillation who presents for blood pressure management.  He experiences elevated blood pressure despite taking amlodipine , hydrochlorothiazide , and atorvastatin  80 mg. He also takes Eliquis  for atrial fibrillation. He is unsure of the specific medications taken this morning but takes pregabalin  in the evening. Home blood pressure readings remain high.  He smokes twelve cigarettes daily, an increase from eight. He has abstained from alcohol for three months after sustaining rib fractures from a fall. He takes potassium tablets daily and rarely misses doses, except once when attending to his spouse.  He denies cold or heat intolerance, breathing difficulties, or a sensation of a lump in his throat. He experiences leg swelling with overexertion, which resolves with rest.       03/08/2024    8:21 AM 07/21/2023   11:07 AM 03/02/2023   10:43 AM  Depression screen PHQ 2/9  Decreased Interest 0 0 0  Down, Depressed, Hopeless 0 0 0  PHQ - 2 Score 0 0 0  Altered sleeping 0 2   Tired, decreased energy 0 1   Change in appetite 0 0   Feeling bad or failure about yourself  0 0   Trouble concentrating 0 0   Moving slowly or fidgety/restless 0 0   Suicidal thoughts 0 0   PHQ-9 Score 0  3    Difficult doing work/chores Not difficult at all Somewhat difficult      Data saved with a previous flowsheet row definition      07/21/2023   11:07 AM  GAD 7 : Generalized Anxiety Score   Nervous, Anxious, on Edge 0  Control/stop worrying 0  Worry too much - different things 0  Trouble relaxing 0  Restless 0  Easily annoyed or irritable 0  Afraid - awful might happen 0  Total GAD 7 Score 0  Anxiety Difficulty Somewhat difficult    Medications: Outpatient Medications Prior to Visit  Medication Sig   amLODipine  (NORVASC ) 10 MG tablet TAKE 1 TABLET BY MOUTH EVERY DAY   apixaban  (ELIQUIS ) 5 MG TABS tablet Take 1 tablet (5 mg total) by mouth 2 (two) times daily.   atorvastatin  (LIPITOR) 80 MG tablet TAKE 1 TABLET BY MOUTH DAILY AT 6 PM.   Cholecalciferol (VITAMIN D3) 50 MCG (2000 UT) capsule Take 2,000 Units by mouth at bedtime.   cyanocobalamin  1000 MCG tablet Take 1,000 mcg by mouth daily.   ferrous sulfate  324 MG TBEC Take 324 mg by mouth daily with breakfast.   hydrochlorothiazide  (HYDRODIURIL ) 25 MG tablet TAKE 1 TABLET (25 MG TOTAL) BY MOUTH DAILY.   levothyroxine  (SYNTHROID ) 50 MCG tablet TAKE 1 TABLET BY MOUTH EVERY DAY   lidocaine  (LIDODERM ) 5 % Place 2 patches onto the skin daily. Remove & Discard patch within 12 hours or as directed by MD   lidocaine  (LIDODERM ) 5 % Place 1 patch onto the skin daily. Remove & Discard patch within 12 hours or as directed by MD   meloxicam (  MOBIC) 15 MG tablet Take 15 mg by mouth daily.   potassium chloride  (KLOR-CON ) 10 MEQ tablet TAKE 1 TABLET BY MOUTH EVERY DAY   pregabalin  (LYRICA ) 100 MG capsule Take 100 mg by mouth at bedtime.   [DISCONTINUED] amoxicillin -clavulanate (AUGMENTIN ) 875-125 MG tablet Take 1 tablet by mouth 2 (two) times daily. (Patient not taking: Reported on 03/08/2024)   [DISCONTINUED] traMADol  (ULTRAM ) 50 MG tablet Take 1 tablet (50 mg total) by mouth every 6 (six) hours as needed. (Patient not taking: Reported on 03/08/2024)   No facility-administered medications prior to visit.    Review of Systems  All other systems reviewed and are negative.  All negative Except see HPI       Objective    BP  (!) 148/94 Comment: pt bp monitor  Pulse 65   Resp 14   Ht 6' (1.829 m)   Wt 213 lb 3.2 oz (96.7 kg)   SpO2 100%   BMI 28.92 kg/m     Physical Exam Vitals reviewed.  Constitutional:      General: He is not in acute distress.    Appearance: Normal appearance. He is not diaphoretic.  HENT:     Head: Normocephalic and atraumatic.  Eyes:     General: No scleral icterus.    Conjunctiva/sclera: Conjunctivae normal.  Cardiovascular:     Rate and Rhythm: Normal rate and regular rhythm.     Pulses: Normal pulses.     Heart sounds: Normal heart sounds. No murmur heard. Pulmonary:     Effort: Pulmonary effort is normal. No respiratory distress.     Breath sounds: Normal breath sounds. No wheezing or rhonchi.  Musculoskeletal:     Cervical back: Neck supple.     Right lower leg: No edema.     Left lower leg: No edema.  Lymphadenopathy:     Cervical: No cervical adenopathy.  Skin:    General: Skin is warm and dry.     Findings: No rash.  Neurological:     Mental Status: He is alert and oriented to person, place, and time. Mental status is at baseline.  Psychiatric:        Mood and Affect: Mood normal.        Behavior: Behavior normal.      No results found for any visits on 04/01/24.      Assessment and Plan Assessment & Plan Essential hypertension Chronic and unstable Blood pressure elevated despite medication. Possible non-adherence and smoking may contribute. - Added another antihypertensive medication. - Instructed to measure blood pressure at home and bring records to next appointment. - Scheduled follow-up in six weeks.  Atrial fibrillation Managed with Eliquis  for anticoagulation. - Continue Eliquis  twice daily. Will follow-up  Smoking greater than 40 pack years Nicotine dependence, cigarettes Chronic Smoking approximately 12 cigarettes daily. Acknowledged need to quit. - Encouraged reduction in smoking. - Discussed potential lung screening due to smoking  history. Will follow-up  Hyperlipidemia Chronic Managed with atorvastatin  80 mg daily. - Continue atorvastatin  80 mg daily. Will follow-up  Hypothyroidism Chronic Continue current thyroid  medication. - Will check TSH levels at next visit.  Primary hypertension - valsartan (DIOVAN) 40 MG tablet; Take 1 tablet (40 mg total) by mouth daily.  Dispense: 90 tablet; Refill: 3 - CBC with Differential/Platelet - Basic metabolic panel with GFR  Other hyperlipidemia  - CBC with Differential/Platelet - Basic metabolic panel with GFR  Acquired hypothyroidism  - CBC with Differential/Platelet - Basic metabolic panel with GFR  Anemia,  unspecified type -cbc  At risk alcohol consumption Last drink 3 month ago, 2-3 days before his last ov Encouraged to maintain sobriety.  Immunization due  - Pneumococcal conjugate vaccine 20-valent  Will address emphysema Orders Placed This Encounter  Procedures   Pneumococcal conjugate vaccine 20-valent   CBC with Differential/Platelet   Basic metabolic panel with GFR    Return in about 6 weeks (around 05/13/2024) for chronic disease f/u with labs.   The patient was advised to call back or seek an in-person evaluation if the symptoms worsen or if the condition fails to improve as anticipated.  I discussed the assessment and treatment plan with the patient. The patient was provided an opportunity to ask questions and all were answered. The patient agreed with the plan and demonstrated an understanding of the instructions.  I, Danett Palazzo, PA-C have reviewed all documentation for this visit. The documentation on 04/01/2024  for the exam, diagnosis, procedures, and orders are all accurate and complete.  Jolynn Spencer, [facility], MMS [facility] [phone] (phone) [phone] (fax)  [facility]

## 2024-04-02 LAB — CBC WITH DIFFERENTIAL/PLATELET
Basophils Absolute: 0.1 x10E3/uL (ref 0.0–0.2)
Basos: 1 %
EOS (ABSOLUTE): 0.3 x10E3/uL (ref 0.0–0.4)
Eos: 3 %
Hematocrit: 39.1 % (ref 37.5–51.0)
Hemoglobin: 12.8 g/dL — ABNORMAL LOW (ref 13.0–17.7)
Immature Grans (Abs): 0.1 x10E3/uL (ref 0.0–0.1)
Immature Granulocytes: 1 %
Lymphocytes Absolute: 2.4 x10E3/uL (ref 0.7–3.1)
Lymphs: 24 %
MCH: 29.9 pg (ref 26.6–33.0)
MCHC: 32.7 g/dL (ref 31.5–35.7)
MCV: 91 fL (ref 79–97)
Monocytes Absolute: 0.9 x10E3/uL (ref 0.1–0.9)
Monocytes: 9 %
Neutrophils Absolute: 6.2 x10E3/uL (ref 1.4–7.0)
Neutrophils: 62 %
Platelets: 424 x10E3/uL (ref 150–450)
RBC: 4.28 x10E6/uL (ref 4.14–5.80)
RDW: 12.2 % (ref 11.6–15.4)
WBC: 9.9 x10E3/uL (ref 3.4–10.8)

## 2024-04-02 LAB — BASIC METABOLIC PANEL WITH GFR
BUN/Creatinine Ratio: 17 (ref 10–24)
BUN: 18 mg/dL (ref 8–27)
CO2: 23 mmol/L (ref 20–29)
Calcium: 9.9 mg/dL (ref 8.6–10.2)
Chloride: 102 mmol/L (ref 96–106)
Creatinine, Ser: 1.06 mg/dL (ref 0.76–1.27)
Glucose: 93 mg/dL (ref 70–99)
Potassium: 4.2 mmol/L (ref 3.5–5.2)
Sodium: 140 mmol/L (ref 134–144)
eGFR: 75 mL/min/1.73 (ref >59)

## 2024-04-03 ENCOUNTER — Ambulatory Visit: Payer: Self-pay | Admitting: Physician Assistant

## 2024-04-03 ENCOUNTER — Other Ambulatory Visit: Payer: Self-pay | Admitting: Physician Assistant

## 2024-04-25 ENCOUNTER — Telehealth: Payer: Self-pay | Admitting: Cardiology

## 2024-04-25 NOTE — Telephone Encounter (Signed)
 Returned call to pt and he has been made aware that he will need to get the Lyrica .

## 2024-04-25 NOTE — Telephone Encounter (Signed)
*  STAT* If patient is at the pharmacy, call can be transferred to refill team.   1. Which medications need to be refilled? (please list name of each medication and dose if known) pregabalin  (LYRICA ) 100 MG capsule   2. Which pharmacy/location (including street and city if local pharmacy) is medication to be sent to?  [id number] - [place], [place] - [address]    3. Do they need a 30 day or 90 day supply? 90

## 2024-04-27 ENCOUNTER — Telehealth: Payer: Self-pay | Admitting: Physician Assistant

## 2024-04-27 NOTE — Telephone Encounter (Signed)
 Refill Request  [facility] Medication: pregabalin  (LYRICA ) 100 MG capsule [[id number]] Please send in refill request.

## 2024-04-30 ENCOUNTER — Other Ambulatory Visit: Payer: Self-pay | Admitting: Physician Assistant

## 2024-05-26 ENCOUNTER — Encounter: Payer: Self-pay | Admitting: Physician Assistant

## 2024-05-26 ENCOUNTER — Ambulatory Visit: Admitting: Physician Assistant

## 2024-05-26 VITALS — BP 136/84 | HR 69 | Resp 16 | Ht 72.0 in | Wt 218.1 lb

## 2024-05-26 DIAGNOSIS — G8929 Other chronic pain: Secondary | ICD-10-CM | POA: Diagnosis not present

## 2024-05-26 DIAGNOSIS — Z8673 Personal history of transient ischemic attack (TIA), and cerebral infarction without residual deficits: Secondary | ICD-10-CM

## 2024-05-26 DIAGNOSIS — M25562 Pain in left knee: Secondary | ICD-10-CM

## 2024-05-26 DIAGNOSIS — M25561 Pain in right knee: Secondary | ICD-10-CM | POA: Diagnosis not present

## 2024-05-26 DIAGNOSIS — D649 Anemia, unspecified: Secondary | ICD-10-CM

## 2024-05-26 DIAGNOSIS — I480 Paroxysmal atrial fibrillation: Secondary | ICD-10-CM | POA: Diagnosis not present

## 2024-05-26 DIAGNOSIS — R7303 Prediabetes: Secondary | ICD-10-CM

## 2024-05-26 DIAGNOSIS — Z9189 Other specified personal risk factors, not elsewhere classified: Secondary | ICD-10-CM | POA: Diagnosis not present

## 2024-05-26 DIAGNOSIS — E7849 Other hyperlipidemia: Secondary | ICD-10-CM | POA: Diagnosis not present

## 2024-05-26 DIAGNOSIS — E039 Hypothyroidism, unspecified: Secondary | ICD-10-CM | POA: Diagnosis not present

## 2024-05-26 DIAGNOSIS — I10 Essential (primary) hypertension: Secondary | ICD-10-CM | POA: Diagnosis not present

## 2024-05-26 MED ORDER — APIXABAN 5 MG PO TABS: 5.0000 mg | ORAL_TABLET | Freq: Two times a day (BID) | ORAL | 0 refills | Status: DC

## 2024-05-26 NOTE — Progress Notes (Signed)
 " Established patient visit  Patient: Alejandro Rush   DOB: [birth year]   69 y.o. Male  MRN: [id number] Visit Date: 05/26/2024  Today's healthcare provider: Jolynn Spencer, PA-C   Chief Complaint  Patient presents with   Medical Management of Chronic Issues    6 week follow-up HTN   Feet Problem    Patient reports that he is having some burning feeling. Frequency: 2-3 month   Subjective     HPI     Medical Management of Chronic Issues    Additional comments: 6 week follow-up HTN        Feet Problem    Additional comments: Patient reports that he is having some burning feeling. Frequency: 2-3 month      Last edited by Rosas, Joseline E, CMA on 05/26/2024 10:02 AM.       Discussed the use of AI scribe software for clinical note transcription with the patient, who gave verbal consent to proceed.  History of Present Illness Alejandro Rush is a 69 year old male with hypertension and atrial fibrillation who presents for medication management.  He has hypertension and takes amlodipine  10 mg, hydrochlorothiazide  25 mg, and potassium. He is not taking his previously prescribed valsartan .  He is unclear who discontinued this medication  He has atrial fibrillation and was prescribed Eliquis  (apixaban ) 5 mg twice daily but is not taking it due to a miscommunication about the prescription. He is unsure why it was stopped.  He had a prior stroke.  Has some residual knee issues. He uses a brace at times and takes ibuprofen  for knee pain.  He does not drink alcohol currently and smokes. He takes atorvastatin  (Lipitor) 80 mg for cholesterol.  He denies shortness of breath, bowel problems, swelling, depression, and anxiety.       05/26/2024    9:58 AM 03/08/2024    8:21 AM 07/21/2023   11:07 AM  Depression screen PHQ 2/9  Decreased Interest 0 0 0  Down, Depressed, Hopeless 0 0 0  PHQ - 2 Score 0 0 0  Altered sleeping  0 2  Tired, decreased energy  0 1  Change in appetite  0 0   Feeling bad or failure about yourself   0 0  Trouble concentrating  0 0  Moving slowly or fidgety/restless  0 0  Suicidal thoughts  0 0  PHQ-9 Score  0  3   Difficult doing work/chores  Not difficult at all Somewhat difficult     Data saved with a previous flowsheet row definition      05/26/2024    9:58 AM 07/21/2023   11:07 AM  GAD 7 : Generalized Anxiety Score  Nervous, Anxious, on Edge 0 0  Control/stop worrying 0 0  Worry too much - different things 0 0  Trouble relaxing 1 0  Restless 1 0  Easily annoyed or irritable 0 0  Afraid - awful might happen 0 0  Total GAD 7 Score 2 0  Anxiety Difficulty Somewhat difficult Somewhat difficult    Medications: Show/hide medication list[1]  Review of Systems All negative Except see HPI       Objective    BP 136/84 (BP Location: Right Arm, Patient Position: Sitting, Cuff Size: Normal)   Pulse 69   Resp 16   Ht 6' (1.829 m)   Wt 218 lb 1.6 oz (98.9 kg)   SpO2 96%   BMI 29.58 kg/m     Physical Exam Vitals reviewed.  Constitutional:  General: He is not in acute distress.    Appearance: Normal appearance. He is not diaphoretic.  HENT:     Head: Normocephalic and atraumatic.  Eyes:     General: No scleral icterus.    Conjunctiva/sclera: Conjunctivae normal.  Cardiovascular:     Rate and Rhythm: Normal rate and regular rhythm.     Pulses: Normal pulses.     Heart sounds: Normal heart sounds. No murmur heard. Pulmonary:     Effort: Pulmonary effort is normal. No respiratory distress.     Breath sounds: Normal breath sounds. No wheezing or rhonchi.  Musculoskeletal:     Cervical back: Neck supple.     Right lower leg: No edema.     Left lower leg: No edema.  Lymphadenopathy:     Cervical: No cervical adenopathy.  Skin:    General: Skin is warm and dry.     Findings: No rash.  Neurological:     Mental Status: He is alert and oriented to person, place, and time. Mental status is at baseline.  Psychiatric:         Mood and Affect: Mood normal.        Behavior: Behavior normal.      No results found for any visits on 05/26/24.      Assessment & Plan Paroxysmal atrial fibrillation Importance of Eliquis  for atrial fibrillation management discussed and stroke prevention. Hx of stroke Patient is unclear why Eliquis  was stopped - Prescribed Eliquis  5 mg twice daily for one month. - Instructed to discuss Eliquis  prescription with cardiologist, Dr. Naomie, at upcoming appointment. Per cardiology's OV from 02/2024, patient should continue to take Eliquis  5 mg twice daily Continue taking atorvastatin  80 mg Will follow-up  Primary hypertension Chronic and improved  blood pressure improved with current regimen.  Patient unclear why he stopped taking valsartan  - Continue amlodipine  and hydrochlorothiazide . - Discuss valsartan  prescription with cardiologist. Will follow-up  Other hyperlipidemia Chronic  Continued management with atorvastatin  is crucial due to stroke history. Importance of cholesterol management emphasized. - Continue atorvastatin  80 mg daily. Should update labs Will follow-up  Acquired hypothyroidism Chronic and stable Need to monitor thyroid  function and adjust medication as necessary discussed. - Ordered thyroid  function tests. Will follow-up   Primary hypertension (Primary)  - CBC with Differential/Platelet - Hemoglobin A1c - TSH - Basic metabolic panel with GFR  Anemia, unspecified type  - CBC with Differential/Platelet  Acquired hypothyroidism  - CBC with Differential/Platelet - TSH  Paroxysmal atrial fibrillation (HCC) - CBC with Differential/Platelet - Hemoglobin A1c - TSH - Basic metabolic panel-I with GFR - apixaban  (ELIQUIS ) 5 MG TABS tablet; Take 1 tablet (5 mg total) by mouth 2 (two) times daily.  Dispense: 60 tablet; Refill: 0  History of stroke - apixaban  (ELIQUIS ) 5 MG TABS tablet; Take 1 tablet (5 mg total) by mouth 2 (two) times daily.   Dispense: 60 tablet; Refill: 0  Prediabetes Last A1c was 6.2 from 06/16/2023 - Hemoglobin A1c Advise low-carb diet and regular exercise Will follow-up  Knee pain Bilateral chronic Managed by orthopedics  Risk alcohol consumption Patient denies drinking alcohol recently will Follow-up Orders Placed This Encounter  Procedures   CBC with Differential/Platelet   Hemoglobin A1c   TSH   Basic metabolic panel with GFR    Return in about 3 months (around 08/24/2024) for chronic disease f/u.   The patient was advised to call back or seek an in-person evaluation if the symptoms worsen or if the condition fails  to improve as anticipated.  I discussed the assessment and treatment plan with the patient. The patient was provided an opportunity to ask questions and all were answered. The patient agreed with the plan and demonstrated an understanding of the instructions.  I, Lyrique Hakim, PA-C have reviewed all documentation for this visit. The documentation on 05/26/2024  for the exam, diagnosis, procedures, and orders are all accurate and complete.  Jolynn Spencer, [facility], MMS [facility] [phone] (phone) [phone] (fax)  [facility]    [1]  Outpatient Medications Prior to Visit  Medication Sig   amLODipine  (NORVASC ) 10 MG tablet TAKE 1 TABLET BY MOUTH EVERY DAY   atorvastatin  (LIPITOR) 80 MG tablet TAKE 1 TABLET BY MOUTH DAILY AT 6 PM.   Cholecalciferol (VITAMIN D3) 50 MCG (2000 UT) capsule Take 2,000 Units by mouth at bedtime.   cyanocobalamin  1000 MCG tablet Take 1,000 mcg by mouth daily.   ferrous sulfate  324 MG TBEC Take 324 mg by mouth daily with breakfast.   hydrochlorothiazide  (HYDRODIURIL ) 25 MG tablet TAKE 1 TABLET (25 MG TOTAL) BY MOUTH DAILY.   levothyroxine  (SYNTHROID ) 50 MCG tablet TAKE 1 TABLET BY MOUTH EVERY DAY   meloxicam (MOBIC) 15 MG tablet Take 15 mg by mouth daily.   potassium chloride  (KLOR-CON ) 10 MEQ tablet TAKE 1 TABLET BY  MOUTH EVERY DAY   pregabalin  (LYRICA ) 100 MG capsule Take 100 mg by mouth at bedtime.   [DISCONTINUED] ELIQUIS  5 MG TABS tablet TAKE 1 TABLET BY MOUTH TWICE A DAY   valsartan  (DIOVAN ) 40 MG tablet Take 1 tablet (40 mg total) by mouth daily.   [DISCONTINUED] lidocaine  (LIDODERM ) 5 % Place 2 patches onto the skin daily. Remove & Discard patch within 12 hours or as directed by MD   [DISCONTINUED] lidocaine  (LIDODERM ) 5 % Place 1 patch onto the skin daily. Remove & Discard patch within 12 hours or as directed by MD   No facility-administered medications prior to visit.   "

## 2024-05-27 ENCOUNTER — Ambulatory Visit: Payer: Self-pay | Admitting: Physician Assistant

## 2024-05-27 LAB — CBC WITH DIFFERENTIAL/PLATELET
Basophils Absolute: 0.1 x10E3/uL (ref 0.0–0.2)
Basos: 1 %
EOS (ABSOLUTE): 0.4 x10E3/uL (ref 0.0–0.4)
Eos: 3 %
Hematocrit: 40.1 % (ref 37.5–51.0)
Hemoglobin: 13.3 g/dL (ref 13.0–17.7)
Immature Grans (Abs): 0.1 x10E3/uL (ref 0.0–0.1)
Immature Granulocytes: 1 %
Lymphocytes Absolute: 3.1 x10E3/uL (ref 0.7–3.1)
Lymphs: 28 %
MCH: 30 pg (ref 26.6–33.0)
MCHC: 33.2 g/dL (ref 31.5–35.7)
MCV: 90 fL (ref 79–97)
Monocytes Absolute: 1 x10E3/uL — ABNORMAL HIGH (ref 0.1–0.9)
Monocytes: 9 %
Neutrophils Absolute: 6.5 x10E3/uL (ref 1.4–7.0)
Neutrophils: 58 %
Platelets: 430 x10E3/uL (ref 150–450)
RBC: 4.44 x10E6/uL (ref 4.14–5.80)
RDW: 12.8 % (ref 11.6–15.4)
WBC: 11.1 x10E3/uL — ABNORMAL HIGH (ref 3.4–10.8)

## 2024-05-27 LAB — TSH: TSH: 1.03 u[IU]/mL (ref 0.450–4.500)

## 2024-05-27 LAB — BASIC METABOLIC PANEL WITH GFR
BUN/Creatinine Ratio: 17 (ref 10–24)
BUN: 20 mg/dL (ref 8–27)
CO2: 23 mmol/L (ref 20–29)
Calcium: 10.1 mg/dL (ref 8.6–10.2)
Chloride: 100 mmol/L (ref 96–106)
Creatinine, Ser: 1.19 mg/dL (ref 0.76–1.27)
Glucose: 99 mg/dL (ref 70–99)
Potassium: 3.9 mmol/L (ref 3.5–5.2)
Sodium: 140 mmol/L (ref 134–144)
eGFR: 66 mL/min/1.73

## 2024-05-27 LAB — HEMOGLOBIN A1C
Est. average glucose Bld gHb Est-mCnc: 123 mg/dL
Hgb A1c MFr Bld: 5.9 % — ABNORMAL HIGH (ref 4.8–5.6)

## 2024-05-31 ENCOUNTER — Telehealth: Payer: Self-pay | Admitting: Cardiology

## 2024-05-31 DIAGNOSIS — I480 Paroxysmal atrial fibrillation: Secondary | ICD-10-CM

## 2024-05-31 DIAGNOSIS — Z8673 Personal history of transient ischemic attack (TIA), and cerebral infarction without residual deficits: Secondary | ICD-10-CM

## 2024-05-31 NOTE — Telephone Encounter (Signed)
" °*  STAT* If patient is at the pharmacy, call can be transferred to refill team.   1. Which medications need to be refilled? (please list name of each medication and dose if known) apixaban  (ELIQUIS ) 5 MG TABS tablet    2. Would you like to learn more about the convenience, safety, & potential cost savings by using the [facility]   3. Are you open to using the [facility] (Type [facility].  ).   4. Which pharmacy/location (including street and city if local pharmacy) is medication to be sent to?  [id number] - [place], [place] - [address]     5. Do they need a 30 day or 90 day supply? 90 day "

## 2024-06-02 MED ORDER — APIXABAN 5 MG PO TABS: 5.0000 mg | ORAL_TABLET | Freq: Two times a day (BID) | ORAL | 1 refills | Status: AC

## 2024-06-02 NOTE — Telephone Encounter (Signed)
 Prescription refill request for Eliquis  received. Indication: AFIB Last office visit: 02/18/2024 Scr: 1.19 05/26/24 Age: 69 Weight: 98.9KG   Eliquis  5 mg bid refill has been sent.

## 2024-06-20 ENCOUNTER — Other Ambulatory Visit: Payer: Self-pay | Admitting: Cardiology

## 2024-08-25 ENCOUNTER — Ambulatory Visit: Admitting: Physician Assistant

## 2025-03-14 ENCOUNTER — Ambulatory Visit
